# Patient Record
Sex: Female | Born: 1960
Health system: Southern US, Community
[De-identification: ages and names within clinical notes are randomized; demographics above are authoritative.]

## PROBLEM LIST (undated history)

## (undated) DIAGNOSIS — I10 Essential (primary) hypertension: Secondary | ICD-10-CM

## (undated) DIAGNOSIS — K219 Gastro-esophageal reflux disease without esophagitis: Secondary | ICD-10-CM

## (undated) HISTORY — DX: Gastro-esophageal reflux disease without esophagitis: K21.9

---

## 1991-11-10 HISTORY — PX: TUBAL LIGATION: SHX77

## 1999-10-16 ENCOUNTER — Other Ambulatory Visit: Admission: RE | Admit: 1999-10-16 | Discharge: 1999-10-16 | Payer: Self-pay | Admitting: Obstetrics and Gynecology

## 2000-12-01 ENCOUNTER — Other Ambulatory Visit: Admission: RE | Admit: 2000-12-01 | Discharge: 2000-12-01 | Payer: Self-pay | Admitting: Obstetrics and Gynecology

## 2005-06-01 ENCOUNTER — Encounter: Admission: RE | Admit: 2005-06-01 | Discharge: 2005-08-30 | Payer: Self-pay | Admitting: Internal Medicine

## 2006-06-25 ENCOUNTER — Ambulatory Visit (HOSPITAL_COMMUNITY): Admission: RE | Admit: 2006-06-25 | Discharge: 2006-06-25 | Payer: Self-pay | Admitting: Internal Medicine

## 2009-06-18 ENCOUNTER — Ambulatory Visit (HOSPITAL_COMMUNITY): Admission: RE | Admit: 2009-06-18 | Discharge: 2009-06-18 | Payer: Self-pay | Admitting: Internal Medicine

## 2009-07-26 ENCOUNTER — Ambulatory Visit (HOSPITAL_COMMUNITY): Admission: RE | Admit: 2009-07-26 | Discharge: 2009-07-26 | Payer: Self-pay | Admitting: Gastroenterology

## 2009-12-25 ENCOUNTER — Emergency Department (HOSPITAL_COMMUNITY): Admission: EM | Admit: 2009-12-25 | Discharge: 2009-12-25 | Payer: Self-pay | Admitting: Emergency Medicine

## 2010-12-01 ENCOUNTER — Encounter: Payer: Self-pay | Admitting: Internal Medicine

## 2012-08-01 ENCOUNTER — Other Ambulatory Visit (HOSPITAL_COMMUNITY): Payer: Self-pay | Admitting: Internal Medicine

## 2012-08-01 DIAGNOSIS — Z1231 Encounter for screening mammogram for malignant neoplasm of breast: Secondary | ICD-10-CM

## 2012-08-08 ENCOUNTER — Ambulatory Visit
Admission: RE | Admit: 2012-08-08 | Discharge: 2012-08-08 | Disposition: A | Discharge status: Home / Self Care | Payer: 59 | Source: Ambulatory Visit | Attending: Internal Medicine | Admitting: Internal Medicine

## 2012-08-08 DIAGNOSIS — Z1231 Encounter for screening mammogram for malignant neoplasm of breast: Secondary | ICD-10-CM

## 2012-09-07 ENCOUNTER — Emergency Department (HOSPITAL_COMMUNITY): Payer: 59

## 2012-09-07 ENCOUNTER — Emergency Department (HOSPITAL_COMMUNITY)
Admission: EM | Admit: 2012-09-07 | Discharge: 2012-09-07 | Disposition: A | Discharge status: Home / Self Care | Payer: 59 | Attending: Emergency Medicine | Admitting: Emergency Medicine

## 2012-09-07 ENCOUNTER — Encounter (HOSPITAL_COMMUNITY): Payer: Self-pay

## 2012-09-07 DIAGNOSIS — Z79899 Other long term (current) drug therapy: Secondary | ICD-10-CM | POA: Insufficient documentation

## 2012-09-07 DIAGNOSIS — I959 Hypotension, unspecified: Secondary | ICD-10-CM | POA: Insufficient documentation

## 2012-09-07 DIAGNOSIS — R109 Unspecified abdominal pain: Secondary | ICD-10-CM | POA: Insufficient documentation

## 2012-09-07 DIAGNOSIS — R42 Dizziness and giddiness: Secondary | ICD-10-CM | POA: Insufficient documentation

## 2012-09-07 DIAGNOSIS — I10 Essential (primary) hypertension: Secondary | ICD-10-CM | POA: Insufficient documentation

## 2012-09-07 DIAGNOSIS — D649 Anemia, unspecified: Secondary | ICD-10-CM | POA: Insufficient documentation

## 2012-09-07 HISTORY — DX: Essential (primary) hypertension: I10

## 2012-09-07 LAB — CBC WITH DIFFERENTIAL/PLATELET
Basophils Absolute: 0.1 10*3/uL (ref 0.0–0.1)
Basophils Relative: 1 % (ref 0–1)
HCT: 30.4 % — ABNORMAL LOW (ref 36.0–46.0)
Lymphocytes Relative: 35 % (ref 12–46)
MCH: 19.8 pg — ABNORMAL LOW (ref 26.0–34.0)
MCHC: 31.6 g/dL (ref 30.0–36.0)
MCV: 62.8 fL — ABNORMAL LOW (ref 78.0–100.0)
Monocytes Absolute: 0.7 10*3/uL (ref 0.1–1.0)
Platelets: 373 10*3/uL (ref 150–400)
RBC: 4.84 MIL/uL (ref 3.87–5.11)

## 2012-09-07 LAB — URINALYSIS, ROUTINE W REFLEX MICROSCOPIC
Glucose, UA: NEGATIVE mg/dL
Leukocytes, UA: NEGATIVE
Nitrite: NEGATIVE
Protein, ur: NEGATIVE mg/dL
Urobilinogen, UA: 1 mg/dL (ref 0.0–1.0)

## 2012-09-07 LAB — URINE MICROSCOPIC-ADD ON

## 2012-09-07 LAB — BASIC METABOLIC PANEL
BUN: 14 mg/dL (ref 6–23)
Calcium: 8.9 mg/dL (ref 8.4–10.5)
Chloride: 101 mEq/L (ref 96–112)
Creatinine, Ser: 0.96 mg/dL (ref 0.50–1.10)
Glucose, Bld: 110 mg/dL — ABNORMAL HIGH (ref 70–99)
Potassium: 3.2 mEq/L — ABNORMAL LOW (ref 3.5–5.1)
Sodium: 137 mEq/L (ref 135–145)

## 2012-09-07 MED ORDER — SODIUM CHLORIDE 0.9 % IV BOLUS (SEPSIS)
1000.0000 mL | Freq: Once | INTRAVENOUS | Status: AC
  Administered 2012-09-07: 1000 mL via INTRAVENOUS

## 2012-09-07 MED ORDER — FERROUS SULFATE 325 (65 FE) MG PO TABS: 325.0000 mg | ORAL_TABLET | Freq: Every day | ORAL | Status: DC

## 2012-09-07 MED ORDER — ONDANSETRON HCL 4 MG/2ML IJ SOLN
4.0000 mg | Freq: Once | INTRAMUSCULAR | Status: DC
  Filled 2012-09-07: qty 2

## 2012-09-07 NOTE — ED Notes (Signed)
Patient vomited.

## 2012-09-07 NOTE — ED Provider Notes (Signed)
History     CSN: [id number]  Arrival date & time 09/07/12  1333   First MD Initiated Contact with Patient 09/07/12 1335      Chief Complaint  Patient presents with  . Hypotension    (Consider location/radiation/quality/duration/timing/severity/associated sxs/prior treatment) The history is provided by the patient.   patient presents with a syncopal episode. She was at lunch today and became lightheaded and passed out. No chest pain. She states she does have some lower abdominal pain. No shortness of breath. She's had some nauseousness but not vomiting. She states that she recently had a period but has not had one in 5 months. Patient states she took a second dose of her Diovan accidentally last night. She normally takes it once a day in the morning.  Past Medical History  Diagnosis Date  . Hypertension     Past Surgical History  Procedure Date  . Tubal ligation 1993    No family history on file.  History  Substance Use Topics  . Smoking status: Never Smoker   . Smokeless tobacco: Not on file  . Alcohol Use: No    OB History    Grav Para Term Preterm Abortions TAB SAB Ect Mult Living                  Review of Systems  Constitutional: Negative for activity change and appetite change.  HENT: Negative for neck stiffness.   Eyes: Negative for pain.  Respiratory: Negative for chest tightness and shortness of breath.   Cardiovascular: Negative for chest pain and leg swelling.  Gastrointestinal: Negative for nausea, vomiting, abdominal pain and diarrhea.  Genitourinary: Negative for flank pain.  Musculoskeletal: Negative for back pain.  Skin: Negative for rash.  Neurological: Positive for syncope and light-headedness. Negative for weakness, numbness and headaches.  Psychiatric/Behavioral: Negative for behavioral problems.    Allergies  Codeine  Home Medications   Current Outpatient Rx  Name Route Sig Dispense Refill  . CHOLECALCIFEROL PO Oral Take 1 tablet by  mouth daily.    Marland Kitchen FISH OIL PO Oral Take 1 capsule by mouth daily.    Marland Kitchen OVER THE COUNTER MEDICATION Oral Take 1 packet by mouth every morning. OTC Soy Supplement.    Marland Kitchen VALSARTAN-HYDROCHLOROTHIAZIDE 160-25 MG PO TABS Oral Take 1 tablet by mouth daily.      BP 124/64  Pulse 75  Temp 97.4 F (36.3 C) (Oral)  Resp 10  SpO2 100%  LMP 09/07/2012  Physical Exam  Nursing note and vitals reviewed. Constitutional: She is oriented to person, place, and time. She appears well-developed and well-nourished.  HENT:  Head: Normocephalic and atraumatic.  Eyes: EOM are normal. Pupils are equal, round, and reactive to light.  Neck: Normal range of motion. Neck supple.  Cardiovascular: Normal rate, regular rhythm and normal heart sounds.   No murmur heard. Pulmonary/Chest: Effort normal and breath sounds normal. No respiratory distress. She has no wheezes. She has no rales.  Abdominal: Soft. Bowel sounds are normal. She exhibits no distension. There is tenderness. There is no rebound and no guarding.       Mild suprapubic tenderness without rebound or guarding.  Musculoskeletal: Normal range of motion.  Neurological: She is alert and oriented to person, place, and time. No cranial nerve deficit.  Skin: Skin is warm and dry.  Psychiatric: She has a normal mood and affect. Her speech is normal.    ED Course  Procedures (including critical care time)   Labs Reviewed  BASIC METABOLIC PANEL  CBC WITH DIFFERENTIAL  URINALYSIS, ROUTINE W REFLEX MICROSCOPIC  TROPONIN I   No results found.   No diagnosis found.   Date: 09/07/2012  Rate: 64  Rhythm: normal sinus rhythm  QRS Axis: normal  Intervals: normal  ST/T Wave abnormalities: nonspecific T wave changes  Conduction Disutrbances:none  Narrative Interpretation:   Old EKG Reviewed: none available    MDM  Patient with syncope and hypotension. Likely related to extra dose of her antihypertensive. Blood pressures improved here. She also  has an anemia. Patient has not noted black stools. She deferred a rectal exam at this time and will followup with her primary care Dr. Urinalysis is pending at this time, however patient likely be discharged        [facility] R. Rubin Payor, MD 09/07/12 [phone]

## 2012-09-07 NOTE — ED Notes (Signed)
Patient undressed and in a gown. Cardiac monitor, pulse oximetry, and blood pressure cuff on. 

## 2012-09-07 NOTE — ED Notes (Signed)
Patient felt weak and syncopal

## 2013-10-16 ENCOUNTER — Other Ambulatory Visit: Payer: Self-pay

## 2013-10-16 DIAGNOSIS — Z1231 Encounter for screening mammogram for malignant neoplasm of breast: Secondary | ICD-10-CM

## 2013-11-14 ENCOUNTER — Ambulatory Visit
Admission: RE | Admit: 2013-11-14 | Discharge: 2013-11-14 | Disposition: A | Discharge status: Home / Self Care | Payer: 59 | Source: Ambulatory Visit

## 2013-11-14 DIAGNOSIS — Z1231 Encounter for screening mammogram for malignant neoplasm of breast: Secondary | ICD-10-CM

## 2014-04-14 ENCOUNTER — Encounter: Payer: 59 | Attending: Internal Medicine | Admitting: *Deleted

## 2014-04-14 DIAGNOSIS — Z713 Dietary counseling and surveillance: Secondary | ICD-10-CM | POA: Insufficient documentation

## 2014-04-14 DIAGNOSIS — E669 Obesity, unspecified: Secondary | ICD-10-CM | POA: Insufficient documentation

## 2014-04-14 NOTE — Progress Notes (Signed)
Patient was seen on 04/14/2014 for the Weight Loss Class at the [facility]. The following learning objectives were met by the patient during this class:   Describe healthy choices in each food group  Describe portion size of foods  Use plate method for meal planning  Demonstrate how to read Nutrition Facts food label  Set realistic goals for weight loss, diet changes, and physical activity.   Goals:  1. Make healthy food choices in each food group.  2. Reduce portion size of foods.  3. Increase fruit and vegetable intake.  4. Use plate method for meal planning.  5. Increase physical activity.   Handouts given:  1. Weight loss tips 2. Meal plan/portion card 2. Plate method  2. Food label handout 

## 2015-12-12 MED FILL — QSYMIA 11.25 MG-69 MG CAP: 11.25-69 | 30 days supply | Qty: 30 | Fill #2

## 2015-12-13 MED FILL — VALSARTAN-HCTZ 160-25 MG TA: 160-25 | 60 days supply | Qty: 90 | Fill #0

## 2016-02-06 DIAGNOSIS — I10 Essential (primary) hypertension: Secondary | ICD-10-CM | POA: Diagnosis not present

## 2016-02-06 DIAGNOSIS — J309 Allergic rhinitis, unspecified: Secondary | ICD-10-CM | POA: Diagnosis not present

## 2016-02-06 DIAGNOSIS — R7309 Other abnormal glucose: Secondary | ICD-10-CM | POA: Diagnosis not present

## 2016-05-07 DIAGNOSIS — E784 Other hyperlipidemia: Secondary | ICD-10-CM | POA: Diagnosis not present

## 2016-05-07 DIAGNOSIS — R7303 Prediabetes: Secondary | ICD-10-CM | POA: Diagnosis not present

## 2016-05-07 DIAGNOSIS — I10 Essential (primary) hypertension: Secondary | ICD-10-CM | POA: Diagnosis not present

## 2017-02-08 DIAGNOSIS — E784 Other hyperlipidemia: Secondary | ICD-10-CM | POA: Diagnosis not present

## 2017-02-08 DIAGNOSIS — Z6837 Body mass index (BMI) 37.0-37.9, adult: Secondary | ICD-10-CM | POA: Diagnosis not present

## 2017-02-08 DIAGNOSIS — Z1231 Encounter for screening mammogram for malignant neoplasm of breast: Secondary | ICD-10-CM | POA: Diagnosis not present

## 2017-02-08 DIAGNOSIS — Z0000 Encounter for general adult medical examination without abnormal findings: Secondary | ICD-10-CM | POA: Diagnosis not present

## 2017-02-08 DIAGNOSIS — R7303 Prediabetes: Secondary | ICD-10-CM | POA: Diagnosis not present

## 2017-02-08 DIAGNOSIS — R7309 Other abnormal glucose: Secondary | ICD-10-CM | POA: Diagnosis not present

## 2017-02-08 DIAGNOSIS — E669 Obesity, unspecified: Secondary | ICD-10-CM | POA: Diagnosis not present

## 2017-02-08 DIAGNOSIS — I10 Essential (primary) hypertension: Secondary | ICD-10-CM | POA: Diagnosis not present

## 2017-08-09 DIAGNOSIS — E669 Obesity, unspecified: Secondary | ICD-10-CM | POA: Diagnosis not present

## 2017-08-09 DIAGNOSIS — Z1231 Encounter for screening mammogram for malignant neoplasm of breast: Secondary | ICD-10-CM | POA: Diagnosis not present

## 2017-08-09 DIAGNOSIS — R7303 Prediabetes: Secondary | ICD-10-CM | POA: Diagnosis not present

## 2017-08-09 DIAGNOSIS — E782 Mixed hyperlipidemia: Secondary | ICD-10-CM | POA: Diagnosis not present

## 2017-08-09 DIAGNOSIS — I10 Essential (primary) hypertension: Secondary | ICD-10-CM | POA: Diagnosis not present

## 2017-08-09 DIAGNOSIS — Z6837 Body mass index (BMI) 37.0-37.9, adult: Secondary | ICD-10-CM | POA: Diagnosis not present

## 2017-08-09 MED FILL — OLMESARTAN MEDOXOMIL 5 MG T: 5 | 90 days supply | Qty: 90 | Fill #0

## 2017-09-20 DIAGNOSIS — I10 Essential (primary) hypertension: Secondary | ICD-10-CM | POA: Diagnosis not present

## 2017-11-10 MED FILL — OLMESARTAN MEDOXOMIL 5 MG T: 5 | 90 days supply | Qty: 90 | Fill #1

## 2017-11-11 ENCOUNTER — Other Ambulatory Visit: Payer: Self-pay | Admitting: Nurse Practitioner

## 2017-11-11 DIAGNOSIS — Z1231 Encounter for screening mammogram for malignant neoplasm of breast: Secondary | ICD-10-CM

## 2017-12-01 ENCOUNTER — Encounter: Payer: Self-pay | Admitting: Radiology

## 2017-12-01 ENCOUNTER — Ambulatory Visit
Admission: RE | Admit: 2017-12-01 | Discharge: 2017-12-01 | Disposition: A | Discharge status: Home / Self Care | Payer: 59 | Source: Ambulatory Visit | Attending: Nurse Practitioner | Admitting: Nurse Practitioner

## 2017-12-01 DIAGNOSIS — Z1231 Encounter for screening mammogram for malignant neoplasm of breast: Secondary | ICD-10-CM | POA: Diagnosis not present

## 2018-02-14 DIAGNOSIS — Z01411 Encounter for gynecological examination (general) (routine) with abnormal findings: Secondary | ICD-10-CM | POA: Diagnosis not present

## 2018-02-14 DIAGNOSIS — Z01419 Encounter for gynecological examination (general) (routine) without abnormal findings: Secondary | ICD-10-CM | POA: Diagnosis not present

## 2018-02-14 DIAGNOSIS — R7303 Prediabetes: Secondary | ICD-10-CM | POA: Diagnosis not present

## 2018-02-14 DIAGNOSIS — Z1231 Encounter for screening mammogram for malignant neoplasm of breast: Secondary | ICD-10-CM | POA: Diagnosis not present

## 2018-02-14 DIAGNOSIS — Z0000 Encounter for general adult medical examination without abnormal findings: Secondary | ICD-10-CM | POA: Diagnosis not present

## 2018-02-14 DIAGNOSIS — I10 Essential (primary) hypertension: Secondary | ICD-10-CM | POA: Diagnosis not present

## 2018-02-14 DIAGNOSIS — E782 Mixed hyperlipidemia: Secondary | ICD-10-CM | POA: Diagnosis not present

## 2018-02-14 DIAGNOSIS — M79643 Pain in unspecified hand: Secondary | ICD-10-CM | POA: Diagnosis not present

## 2018-02-14 MED FILL — OLMESARTAN MEDOXOMIL 5 MG T: 5 | 90 days supply | Qty: 90 | Fill #0 | Status: TO

## 2018-05-13 MED FILL — OLMESARTAN MEDOXOMIL 5 MG T: 5 | 90 days supply | Qty: 90 | Fill #0

## 2018-08-06 ENCOUNTER — Encounter: Payer: Self-pay | Admitting: Nurse Practitioner

## 2018-08-06 DIAGNOSIS — I10 Essential (primary) hypertension: Secondary | ICD-10-CM

## 2018-08-06 DIAGNOSIS — M79643 Pain in unspecified hand: Secondary | ICD-10-CM | POA: Insufficient documentation

## 2018-08-06 DIAGNOSIS — Z1231 Encounter for screening mammogram for malignant neoplasm of breast: Secondary | ICD-10-CM | POA: Insufficient documentation

## 2018-08-15 ENCOUNTER — Ambulatory Visit: Payer: Self-pay | Admitting: Nurse Practitioner

## 2018-08-17 ENCOUNTER — Ambulatory Visit: Payer: Self-pay | Admitting: Nurse Practitioner

## 2018-08-19 ENCOUNTER — Encounter: Payer: Self-pay | Admitting: Nurse Practitioner

## 2018-08-19 ENCOUNTER — Ambulatory Visit (INDEPENDENT_AMBULATORY_CARE_PROVIDER_SITE_OTHER): Payer: 59 | Admitting: Nurse Practitioner

## 2018-08-19 VITALS — BP 132/100 | HR 66 | Temp 98.4°F | Ht 60.25 in | Wt 196.4 lb

## 2018-08-19 DIAGNOSIS — E782 Mixed hyperlipidemia: Secondary | ICD-10-CM | POA: Diagnosis not present

## 2018-08-19 DIAGNOSIS — I10 Essential (primary) hypertension: Secondary | ICD-10-CM | POA: Diagnosis not present

## 2018-08-19 DIAGNOSIS — R7303 Prediabetes: Secondary | ICD-10-CM | POA: Diagnosis not present

## 2018-08-19 MED ORDER — OLMESARTAN MEDOXOMIL 5 MG PO TABS: 5.0000 mg | ORAL_TABLET | Freq: Every day | ORAL | 1 refills | Status: DC

## 2018-08-19 MED FILL — OLMESARTAN MEDOXOMIL 5 MG T: 5 | 90 days supply | Qty: 90 | Fill #0

## 2018-08-19 NOTE — Patient Instructions (Addendum)
-   avoid eating soup and high salt foods.

## 2018-08-19 NOTE — Progress Notes (Addendum)
  Subjective:     Patient ID: Catherine Fritz , female    DOB: 09/01/1961 , 57 y.o.   MRN: [id number]   Drinks approximately 6-10 cups of water daily.  She had her flu shot on Monday.    Hypertension  This is a chronic problem. The current episode started more than 1 year ago. The problem has been gradually worsening since onset. The problem is uncontrolled. Associated symptoms include headaches. Pertinent negatives include no chest pain. (She had the flu shot - at [facility]) Risk factors for coronary artery disease include obesity. Past treatments include angiotensin blockers and lifestyle changes. There are no compliance problems (She has been eating soup all week.).      Past Medical History:  Diagnosis Date  . Hypertension       Current Outpatient Medications:  Marland Kitchen  MULTIPLE VITAMIN PO, Take 1 tablet by mouth daily., Disp: , Rfl:  .  olmesartan (BENICAR) 5 MG tablet, Take 5 mg by mouth daily., Disp: , Rfl:  .  Omega-3 Fatty Acids (FISH OIL PO), Take 1 capsule by mouth daily., Disp: , Rfl:    Allergies  Allergen Reactions  . Codeine Rash     Review of Systems  Constitutional: Negative for fatigue.  Respiratory: Negative.   Cardiovascular: Negative.  Negative for chest pain.  Musculoskeletal: Positive for arthralgias.       Sore right shoulder where she had her flu shot.  Neurological: Positive for headaches.     Today's Vitals   08/19/18 1552  BP: (!) 132/100  Pulse: 66  Temp: 98.4 F (36.9 C)  TempSrc: Oral  SpO2: 98%  Weight: 196 lb 6.4 oz (89.1 kg)  Height: 5' 0.25" (1.53 m)   Body mass index is 38.04 kg/m.   Objective:  Physical Exam  Constitutional: She is oriented to person, place, and time. She appears well-developed and well-nourished.  Eyes: Pupils are equal, round, and reactive to light.  Cardiovascular: Normal rate, regular rhythm, normal heart sounds and intact distal pulses.  Pulmonary/Chest: Effort normal and breath sounds normal.   Neurological: She is alert and oriented to person, place, and time.  Skin: Skin is warm and dry. Capillary refill takes less than 2 seconds.  Psychiatric: She has a normal mood and affect.        Assessment And Plan:   1. Essential hypertension Chronic, elevated today. She is advised to check her blood pressure at home and if remains elevated to take additional olmesartan '5mg'$  this evening, check over the weekend if still elevated take the extra dose and call on Monday with her readings.   Advised if she has increased headaches, blurred vision, nausea or vomiting to go to ER for evaluation - olmesartan (BENICAR) 5 MG tablet; Take 1 tablet (5 mg total) by mouth daily.  Dispense: 90 tablet; Refill: 1  2. Prediabetes  Chronic  Encouraged to increase physical activity and limit intake of sugary foods and drinks.  - BMP8+eGFR - Hemoglobin A1c - Lipid Profile  3. Mixed hyperlipidemia  Chronic,  No current medications  Will check lipid panel - BMP8+eGFR - Hemoglobin A1c - Lipid Profile     [name], FNP

## 2018-08-20 LAB — BMP8+EGFR
BUN/Creatinine Ratio: 11 (ref 9–23)
BUN: 10 mg/dL (ref 6–24)
CHLORIDE: 98 mmol/L (ref 96–106)
CO2: 23 mmol/L (ref 20–29)
Calcium: 10 mg/dL (ref 8.7–10.2)
Creatinine, Ser: 0.94 mg/dL (ref 0.57–1.00)
GFR calc non Af Amer: 67 mL/min/{1.73_m2} (ref >59)
GFR, EST AFRICAN AMERICAN: 77 mL/min/{1.73_m2} (ref >59)
Glucose: 81 mg/dL (ref 65–99)
POTASSIUM: 3.8 mmol/L (ref 3.5–5.2)
Sodium: 139 mmol/L (ref 134–144)

## 2018-08-20 LAB — HEMOGLOBIN A1C
Est. average glucose Bld gHb Est-mCnc: 123 mg/dL
Hgb A1c MFr Bld: 5.9 % — ABNORMAL HIGH (ref 4.8–5.6)

## 2018-08-20 LAB — LIPID PANEL
Chol/HDL Ratio: 3.4 ratio (ref 0.0–4.4)
Cholesterol, Total: 235 mg/dL — ABNORMAL HIGH (ref 100–199)
HDL: 69 mg/dL (ref >39)
LDL Calculated: 149 mg/dL — ABNORMAL HIGH (ref 0–99)
Triglycerides: 84 mg/dL (ref 0–149)
VLDL Cholesterol Cal: 17 mg/dL (ref 5–40)

## 2018-09-27 ENCOUNTER — Other Ambulatory Visit: Payer: Self-pay

## 2018-09-27 ENCOUNTER — Telehealth: Payer: Self-pay

## 2018-09-27 DIAGNOSIS — I10 Essential (primary) hypertension: Secondary | ICD-10-CM

## 2018-09-27 MED ORDER — OLMESARTAN MEDOXOMIL 5 MG PO TABS: 5.0000 mg | ORAL_TABLET | Freq: Every day | ORAL | 0 refills | Status: DC

## 2018-09-27 NOTE — Telephone Encounter (Signed)
Patient called stating her blood pressure has been elevated at 167/88 has been taking benicar 2 tabs a day in the morning and sometimes her bp flucutiates down to 120. Patient wants to know if you want to refill her med or switch her to something different.  Returned pt call after speaking with Arnette FeltsJanece Moore FNP-BC and I advised her to continue to take Benicar BID and check her bp an hour after taking the medication and call on Thursday if her blood pressure is still high. Refill sent to the pharmacy pt stated she did not have any more medicine. YRL,RMA

## 2018-09-28 ENCOUNTER — Other Ambulatory Visit: Payer: Self-pay

## 2018-09-28 DIAGNOSIS — I10 Essential (primary) hypertension: Secondary | ICD-10-CM

## 2018-09-28 MED ORDER — OLMESARTAN MEDOXOMIL 5 MG PO TABS: 5.0000 mg | ORAL_TABLET | Freq: Two times a day (BID) | ORAL | 1 refills | Status: DC

## 2018-09-29 MED FILL — OLMESARTAN MEDOXOMIL 5 MG T: 5 | 30 days supply | Qty: 60 | Fill #0

## 2018-10-27 MED FILL — OLMESARTAN MEDOXOMIL 5 MG T: 5 | 60 days supply | Qty: 120 | Fill #1

## 2018-12-08 ENCOUNTER — Encounter: Payer: Self-pay | Admitting: Nurse Practitioner

## 2018-12-08 ENCOUNTER — Ambulatory Visit: Payer: 59 | Admitting: Nurse Practitioner

## 2018-12-08 VITALS — BP 142/100 | HR 72 | Temp 98.2°F

## 2018-12-08 DIAGNOSIS — R0683 Snoring: Secondary | ICD-10-CM

## 2018-12-08 DIAGNOSIS — Z8349 Family history of other endocrine, nutritional and metabolic diseases: Secondary | ICD-10-CM

## 2018-12-08 DIAGNOSIS — E782 Mixed hyperlipidemia: Secondary | ICD-10-CM

## 2018-12-08 DIAGNOSIS — R7303 Prediabetes: Secondary | ICD-10-CM | POA: Insufficient documentation

## 2018-12-08 DIAGNOSIS — I10 Essential (primary) hypertension: Secondary | ICD-10-CM | POA: Diagnosis not present

## 2018-12-08 MED ORDER — HYDROCHLOROTHIAZIDE 12.5 MG PO TABS: 12.5000 mg | ORAL_TABLET | Freq: Every day | ORAL | 2 refills | Status: DC

## 2018-12-08 MED FILL — HYDROCHLOROTHIAZIDE 12.5 MG: 12.5 | 30 days supply | Qty: 30 | Fill #0

## 2018-12-08 NOTE — Progress Notes (Signed)
Subjective:     Patient ID: Noberto Retort , female    DOB: [birth year] , 58 y.o.   MRN: [id number]   Chief Complaint  Patient presents with  . Hypertension    HPI  Hypertension  This is a chronic problem. The current episode started more than 1 year ago. The problem has been gradually worsening since onset. The problem is uncontrolled. Pertinent negatives include no anxiety, blurred vision, chest pain, malaise/fatigue or palpitations. There are no associated agents to hypertension. There are no known risk factors for coronary artery disease. Past treatments include angiotensin blockers. The current treatment provides moderate improvement. There are no compliance problems.  There is no history of angina. There is no history of chronic renal disease.     Past Medical History:  Diagnosis Date  . Hypertension      Family History  Problem Relation Age of Onset  . Breast cancer Paternal Aunt        in 78's     Current Outpatient Medications:  Marland Kitchen  MULTIPLE VITAMIN PO, Take 1 tablet by mouth daily., Disp: , Rfl:  .  olmesartan (BENICAR) 5 MG tablet, Take 1 tablet (5 mg total) by mouth 2 (two) times daily., Disp: 180 tablet, Rfl: 1   Allergies  Allergen Reactions  . Codeine Rash     Review of Systems  Constitutional: Negative for malaise/fatigue.  Eyes: Negative for blurred vision.  Respiratory: Negative for cough.   Cardiovascular: Negative for chest pain, palpitations and leg swelling.     Today's Vitals   12/08/18 1049  BP: (!) 142/100  Pulse: 72  Temp: 98.2 F (36.8 C)  TempSrc: Oral  PainSc: 0-No pain   There is no height or weight on file to calculate BMI.   Objective:  Physical Exam Vitals signs reviewed.  Constitutional:      General: She is not in acute distress.    Appearance: Normal appearance. She is well-developed. She is obese.  HENT:     Head: Normocephalic and atraumatic.  Eyes:     Pupils: Pupils are equal, round, and reactive to light.   Cardiovascular:     Rate and Rhythm: Normal rate and regular rhythm.     Pulses: Normal pulses.     Heart sounds: Normal heart sounds. No murmur.  Pulmonary:     Effort: Pulmonary effort is normal.     Breath sounds: Normal breath sounds.  Musculoskeletal: Normal range of motion.  Skin:    General: Skin is warm and dry.     Capillary Refill: Capillary refill takes less than 2 seconds.  Neurological:     General: No focal deficit present.     Mental Status: She is alert and oriented to person, place, and time.     Cranial Nerves: No cranial nerve deficit.  Psychiatric:        Mood and Affect: Mood normal.         Assessment And Plan:     1. Essential hypertension  She has cut back to 3 eight hour shifts per week.    Chronic, remains elevated will add 12.5 mg HCTZ and have her to follow up in 4-6 weeks.   - hydrochlorothiazide (HYDRODIURIL) 12.5 MG tablet; Take 1 tablet (12.5 mg total) by mouth daily.  Dispense: 30 tablet; Refill: 2  2. Prediabetes  Chronic, controlled  Continue with current medications  Encouraged to limit intake of sugary foods and drinks  Encouraged to increase physical activity to 150 minutes  per week  3. Mixed hyperlipidemia  Chronic, controlled  No current medications  4. Snoring  Reports snoring  Denies any fatigue or daytime sleepiness  Her blood pressure remains elevated in spite of dual therapy, may be from poor sleep  Will send for sleep study  5. Family history thyroid disease in brother  Sibling had a large goiter removed. - TSH    Arnette FeltsJanece Marra Fraga, FNP

## 2019-01-03 ENCOUNTER — Other Ambulatory Visit: Payer: Self-pay | Admitting: Nurse Practitioner

## 2019-01-05 MED FILL — HYDROCHLOROTHIAZIDE 12.5 MG: 12.5 | 30 days supply | Qty: 30 | Fill #1 | Status: TO

## 2019-01-05 MED FILL — OLMESARTAN MEDOXOMIL 5 MG T: 5 | 60 days supply | Qty: 120 | Fill #2

## 2019-01-18 ENCOUNTER — Encounter: Payer: Self-pay | Admitting: Nurse Practitioner

## 2019-01-18 ENCOUNTER — Other Ambulatory Visit: Payer: Self-pay

## 2019-01-18 ENCOUNTER — Ambulatory Visit: Payer: 59 | Admitting: Nurse Practitioner

## 2019-01-18 VITALS — BP 142/98 | HR 78 | Ht 60.0 in | Wt 197.0 lb

## 2019-01-18 DIAGNOSIS — I10 Essential (primary) hypertension: Secondary | ICD-10-CM | POA: Diagnosis not present

## 2019-01-18 DIAGNOSIS — R2231 Localized swelling, mass and lump, right upper limb: Secondary | ICD-10-CM | POA: Diagnosis not present

## 2019-01-18 MED ORDER — AMLODIPINE BESYLATE 5 MG PO TABS: 5.0000 mg | ORAL_TABLET | Freq: Every day | ORAL | 11 refills | Status: DC

## 2019-01-18 MED FILL — AMLODIPINE BESYLATE 5 MG TA: 5 | 30 days supply | Qty: 30 | Fill #0 | Status: TO

## 2019-01-18 NOTE — Progress Notes (Signed)
  Subjective:     Patient ID: Catherine Fritz , female    DOB: December 16, 1960 , 58 y.o.   MRN: [id number]   Chief Complaint  Patient presents with  . Hypertension    htn , dicuss have order for mammogram     HPI  Hypertension  This is a chronic problem. The current episode started more than 1 year ago. Pertinent negatives include no chest pain, headaches or palpitations.     Past Medical History:  Diagnosis Date  . Hypertension      Family History  Problem Relation Age of Onset  . Breast cancer Paternal Aunt        in 14's     Current Outpatient Medications:  .  hydrochlorothiazide (HYDRODIURIL) 12.5 MG tablet, Take 1 tablet (12.5 mg total) by mouth daily., Disp: 30 tablet, Rfl: 2 .  MULTIPLE VITAMIN PO, Take 1 tablet by mouth daily., Disp: , Rfl:  .  olmesartan (BENICAR) 5 MG tablet, Take 1 tablet (5 mg total) by mouth 2 (two) times daily., Disp: 180 tablet, Rfl: 1   Allergies  Allergen Reactions  . Codeine Rash     Review of Systems  Respiratory: Negative for cough and wheezing.   Cardiovascular: Negative.  Negative for chest pain, palpitations and leg swelling.  Endocrine: Negative for polydipsia, polyphagia and polyuria.  Musculoskeletal: Negative for arthralgias.  Neurological: Negative for dizziness and headaches.     Today's Vitals   01/18/19 1109  BP: (!) 142/98  Pulse: 78  SpO2: 97%  Weight: 197 lb (89.4 kg)  Height: 5' (1.524 m)   Body mass index is 38.47 kg/m.   Objective:  Physical Exam Vitals signs reviewed.  Constitutional:      Appearance: Normal appearance.  Cardiovascular:     Rate and Rhythm: Normal rate and regular rhythm.     Pulses: Normal pulses.     Heart sounds: Normal heart sounds. No murmur.  Pulmonary:     Effort: Pulmonary effort is normal.     Breath sounds: Normal breath sounds.  Skin:    General: Skin is warm and dry.     Capillary Refill: Capillary refill takes less than 2 seconds.  Neurological:     General: No focal  deficit present.     Mental Status: She is alert.  Psychiatric:        Mood and Affect: Mood normal.        Behavior: Behavior normal.        Thought Content: Thought content normal.        Judgment: Judgment normal.         Assessment And Plan:     1. Essential hypertension . B/P is poorly controlled.  . I have advised to limit her intake of . CMP ordered to check renal function.  . The importance of regular exercise and dietary modification was stressed to the patient.  . Stressed importance of losing ten percent of her body weight to help with B/P control.  . The weight loss would help with decreasing cardiac and cancer risk as well.  - amLODipine (NORVASC) 5 MG tablet; Take 1 tablet (5 mg total) by mouth daily.  Dispense: 30 tablet; Refill: 11  2. Mass of right axilla  Unable to feel mass however patient had felt mass  Will send for diagnostic mammogram   Arnette Felts, FNP

## 2019-01-24 ENCOUNTER — Other Ambulatory Visit: Payer: Self-pay

## 2019-01-24 ENCOUNTER — Ambulatory Visit (INDEPENDENT_AMBULATORY_CARE_PROVIDER_SITE_OTHER): Payer: 59 | Admitting: Neurology

## 2019-01-24 ENCOUNTER — Encounter: Payer: Self-pay | Admitting: Neurology

## 2019-01-24 VITALS — BP 144/101 | HR 78 | Ht 61.0 in | Wt 191.0 lb

## 2019-01-24 DIAGNOSIS — R030 Elevated blood-pressure reading, without diagnosis of hypertension: Secondary | ICD-10-CM | POA: Diagnosis not present

## 2019-01-24 DIAGNOSIS — R351 Nocturia: Secondary | ICD-10-CM | POA: Diagnosis not present

## 2019-01-24 DIAGNOSIS — G2581 Restless legs syndrome: Secondary | ICD-10-CM | POA: Diagnosis not present

## 2019-01-24 DIAGNOSIS — E669 Obesity, unspecified: Secondary | ICD-10-CM | POA: Diagnosis not present

## 2019-01-24 DIAGNOSIS — R0683 Snoring: Secondary | ICD-10-CM | POA: Diagnosis not present

## 2019-01-24 NOTE — Progress Notes (Signed)
Subjective:    Patient ID: Catherine Fritz is a 58 y.o. female.  HPI     Catherine Foley, MD, PhD [facility] [address], [address] [address] [place], [place] [address]  Dear Catherine Fritz,   I saw your patient, Catherine Fritz, upon your kind request in my sleep clinic today for initial consultation of her sleep disorder, in particular, concern for underlying obstructive sleep apnea. The patient is unaccompanied today. As you know, Catherine Fritz is a 58 year old right-handed woman with an underlying medical history of hypertension, prediabetes, Hx of hyperlipidemia and obesity, who reports snoring and elevated blood pressure values despite being on medication, concern for OSA is a contributor. I reviewed your office note from 12/08/2018. Her Epworth sleepiness score is 9 out of 24 today, fatigue score is 17 out of 63. She works for [employer], lives with her husband, they have a total of 3 children. She is a nonsmoker and does not utilize alcohol and drinks caffeine in the form of tea  typically, about 2 cups per day on average. For blood pressure, she was on Diovan first, then Benicar, currently on amlodipine. She is also on hydrochlorothiazide. When she was able to lose weight in the realm of 30 pounds in the past, her blood pressure was still elevated. Snoring can be loud and noticeable by her husband, she has woken herself up with the occasional wheezing, she has no history of asthma. 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 She used to work for years from 7 AM to 7 PM. She would have difficulty winding down at night, for the past 3 months she has been able to work from 7 AM to 3 PM. Her bedtime is currently between 9:30 and 11:30. One of her daughter lives with them, she is working on her doctorate in physical therapy. Patient's rise time is generally around 5:45 or 6 AM. She has significant nocturia, up to 4 times a night, averaging about 2-3 times per night. She has had  some intermittent but mostly rare restless leg symptoms. She suspects that her dad had sleep apnea, he was not formally diagnosed, died at 45 from heart disease. She tries to exercise regularly, about 3-4 times per week on average. Weight has been +/- stable.  Her Past Medical History Is Significant For: Past Medical History:  Diagnosis Date  . Hypertension     Her Past Surgical History Is Significant For: Past Surgical History:  Procedure Laterality Date  . TUBAL LIGATION  1993    Her Family History Is Significant For: Family History  Problem Relation Age of Onset  . Breast cancer Paternal Aunt        in 65's    Her Social History Is Significant For: Social History   Socioeconomic History  . Marital status: Married    Spouse name: Not on file  . Number of children: Not on file  . Years of education: Not on file  . Highest education level: Not on file  Occupational History  . Not on file  Social Needs  . Financial resource strain: Not on file  . Food insecurity:    Worry: Not on file    Inability: Not on file  . Transportation needs:    Medical: Not on file    Non-medical: Not on file  Tobacco Use  . Smoking status: Never Smoker  . Smokeless tobacco: Never Used  Substance and Sexual Activity  . Alcohol use:  No  . Drug use: Never  . Sexual activity: Not on file  Lifestyle  . Physical activity:    Days per week: Not on file    Minutes per session: Not on file  . Stress: Not on file  Relationships  . Social connections:    Talks on phone: Not on file    Gets together: Not on file    Attends religious service: Not on file    Active member of club or organization: Not on file    Attends meetings of clubs or organizations: Not on file    Relationship status: Not on file  Other Topics Concern  . Not on file  Social History Narrative  . Not on file    Her Allergies Are:  Allergies  Allergen Reactions  . Codeine Rash  :   Her Current Medications Are:   Outpatient Encounter Medications as of 01/24/2019  Medication Sig  . amLODipine (NORVASC) 5 MG tablet Take 1 tablet (5 mg total) by mouth daily.  . hydrochlorothiazide (HYDRODIURIL) 12.5 MG tablet Take 1 tablet (12.5 mg total) by mouth daily.  . MULTIPLE VITAMIN PO Take 1 tablet by mouth daily.  . [DISCONTINUED] olmesartan (BENICAR) 5 MG tablet Take 1 tablet (5 mg total) by mouth 2 (two) times daily.   No facility-administered encounter medications on file as of 01/24/2019.   :  Review of Systems:  Out of a complete 14 point review of systems, all are reviewed and negative with the exception of these symptoms as listed below: Review of Systems  Neurological:       Pt presents today to discuss her sleep. Pt has never had a sleep study but does endorse snoring.  Epworth Sleepiness Scale 0= would never doze 1= slight chance of dozing 2= moderate chance of dozing 3= high chance of dozing  Sitting and reading: 2 Watching TV: 3 Sitting inactive in a public place (ex. Theater or meeting): 0 As a passenger in a car for an hour without a break: 0 Lying down to rest in the afternoon: 1 Sitting and talking to someone: 0 Sitting quietly after lunch (no alcohol): 2 In a car, while stopped in traffic: 1 Total: 9     Objective:  Neurological Exam  Physical Exam Physical Examination:   Vitals:   01/24/19 0845  BP: (!) 144/101  Pulse: 78    General Examination: The patient is a very pleasant 58 y.o. female in no acute distress. She appears well-developed and well-nourished and well groomed.   HEENT: Normocephalic, atraumatic, pupils are equal, round and reactive to light and accommodation. Corrective eyeglasses in place. Extraocular tracking is good without limitation to gaze excursion or nystagmus noted. Normal smooth pursuit is noted. Hearing is grossly intact. Face is symmetric with normal facial animation and normal facial sensation. Speech is clear with no dysarthria noted. There  is no hypophonia. There is no lip, neck/head, jaw or voice tremor. Neck is supple with full range of passive and active motion. Oropharynx exam reveals: mild mouth dryness, adequate dental hygiene and moderate airway crowding, due to smaller airway entry, redundant soft palate, Mallampati class III, tonsils of 1-2+ bilaterally. Neck circumference is 15-3/8 inches. She has no obvious overbite, perhaps a slight underbite even.  Chest, Heart, Abdomen: benign.   Skin: Warm and dry without trophic changes noted.  Musculoskeletal: exam reveals no obvious joint deformities, tenderness or joint swelling or erythema.   Neurologically:  Mental status: The patient is awake, alert and oriented  in all 4 spheres. Her immediate and remote memory, attention, language skills and fund of knowledge are appropriate. There is no evidence of aphasia, agnosia, apraxia or anomia. Speech is clear with normal prosody and enunciation. Thought process is linear. Mood is normal and affect is normal.  Cranial nerves II - XII are as described above under HEENT exam. In addition: shoulder shrug is normal with equal shoulder height noted. Motor exam: Normal bulk, strength and tone is noted. There is no drift, tremor or rebound. Romberg is negative. Reflexes are 2+ throughout. Fine motor skills and coordination: intact with normal finger taps, normal hand movements, normal rapid alternating patting, normal foot taps and normal foot agility.  Cerebellar testing: No dysmetria or intention tremor on finger to nose testing. There is no truncal or gait ataxia.  Sensory exam: intact to light touch in the upper and lower extremities.  Gait, station and balance: She stands easily. No veering to one side is noted. No leaning to one side is noted. Posture is age-appropriate and stance is narrow based. Gait shows normal stride length and normal pace. No problems turning are noted. Tandem walk is unremarkable.   Assessment and Plan:  In  summary, Catherine Fritz is a very pleasant 58 y.o.-year old female ith an underlying medical history of hypertension, prediabetes, Hx of hyperlipidemia and obesity, whose history and physical exam are concerning for obstructive sleep apnea (OSA). I had a long chat with the patient about my findings and the diagnosis of OSA, its prognosis and treatment options. We talked about medical treatments, surgical interventions and non-pharmacological approaches. I explained in particular the risks and ramifications of untreated moderate to severe OSA, especially with respect to developing cardiovascular disease down the Road, including congestive heart failure, difficult to treat hypertension, cardiac arrhythmias, or stroke. Even type 2 diabetes has, in part, been linked to untreated OSA. Symptoms of untreated OSA include daytime sleepiness, memory problems, mood irritability and mood disorder such as depression and anxiety, lack of energy, as well as recurrent headaches, especially morning headaches. We talked about trying to maintain a healthy lifestyle in general, as well as the importance of weight control. I encouraged the patient to eat healthy, exercise daily and keep well hydrated, to keep a scheduled bedtime and wake time routine, to not skip any meals and eat healthy snacks in between meals. I advised the patient not to drive when feeling sleepy. I recommended the following at this time: sleep study with potential positive airway pressure titration. (We will score hypopneas at 3%).   I explained the sleep test procedure to the patient and also outlined possible surgical and non-surgical treatment options of OSA, including the use of a custom-made dental device (which would require a referral to a specialist dentist), I also explained the CPAP treatment option to the patient, who indicated that she would be willing to try CPAP if the need arises. I explained the importance of being compliant with PAP treatment,  not only for insurance purposes but primarily to improve Her symptoms, and for the patient's long term health benefit, including to reduce Her cardiovascular risks. I answered all her questions today and the patient was in agreement. I plan to see her back after the sleep study is completed and encouraged her to call with any interim questions, concerns, problems or updates.   Thank you very much for allowing me to participate in the care of this nice patient. If I can be of any further assistance to you  please do not hesitate to call me at [phone].  Sincerely,   Star Age, MD, PhD

## 2019-01-24 NOTE — Patient Instructions (Signed)

## 2019-01-27 DIAGNOSIS — H5203 Hypermetropia, bilateral: Secondary | ICD-10-CM | POA: Diagnosis not present

## 2019-02-04 MED FILL — AMLODIPINE BESYLATE 5 MG TA: 5 | 30 days supply | Qty: 30 | Fill #0

## 2019-02-04 MED FILL — HYDROCHLOROTHIAZIDE 12.5 MG: 12.5 | 30 days supply | Qty: 30 | Fill #0

## 2019-02-05 ENCOUNTER — Encounter: Payer: Self-pay | Admitting: Nurse Practitioner

## 2019-02-07 ENCOUNTER — Other Ambulatory Visit: Payer: Self-pay | Admitting: Nurse Practitioner

## 2019-02-07 DIAGNOSIS — R2231 Localized swelling, mass and lump, right upper limb: Secondary | ICD-10-CM

## 2019-02-17 ENCOUNTER — Ambulatory Visit
Admission: RE | Admit: 2019-02-17 | Discharge: 2019-02-17 | Disposition: A | Discharge status: Home / Self Care | Payer: 59 | Source: Ambulatory Visit | Attending: Nurse Practitioner | Admitting: Nurse Practitioner

## 2019-02-17 ENCOUNTER — Other Ambulatory Visit: Payer: Self-pay

## 2019-02-17 DIAGNOSIS — R2231 Localized swelling, mass and lump, right upper limb: Secondary | ICD-10-CM | POA: Diagnosis not present

## 2019-02-17 DIAGNOSIS — N644 Mastodynia: Secondary | ICD-10-CM | POA: Diagnosis not present

## 2019-02-20 ENCOUNTER — Ambulatory Visit: Payer: 59 | Admitting: Nurse Practitioner

## 2019-03-13 ENCOUNTER — Other Ambulatory Visit: Payer: Self-pay

## 2019-03-13 ENCOUNTER — Ambulatory Visit: Payer: 59 | Admitting: Nurse Practitioner

## 2019-03-13 ENCOUNTER — Encounter: Payer: Self-pay | Admitting: Nurse Practitioner

## 2019-03-13 ENCOUNTER — Other Ambulatory Visit: Payer: Self-pay | Admitting: Nurse Practitioner

## 2019-03-13 VITALS — BP 150/92 | HR 69 | Temp 98.4°F | Ht 60.8 in | Wt 191.0 lb

## 2019-03-13 DIAGNOSIS — Z1159 Encounter for screening for other viral diseases: Secondary | ICD-10-CM | POA: Diagnosis not present

## 2019-03-13 DIAGNOSIS — I10 Essential (primary) hypertension: Secondary | ICD-10-CM

## 2019-03-13 DIAGNOSIS — R7303 Prediabetes: Secondary | ICD-10-CM | POA: Diagnosis not present

## 2019-03-13 DIAGNOSIS — Z0000 Encounter for general adult medical examination without abnormal findings: Secondary | ICD-10-CM

## 2019-03-13 DIAGNOSIS — E782 Mixed hyperlipidemia: Secondary | ICD-10-CM

## 2019-03-13 LAB — POCT URINALYSIS DIPSTICK
Bilirubin, UA: NEGATIVE
Blood, UA: NEGATIVE
Glucose, UA: NEGATIVE
Ketones, UA: NEGATIVE
Leukocytes, UA: NEGATIVE
Nitrite, UA: NEGATIVE
Protein, UA: NEGATIVE
Spec Grav, UA: 1.02 (ref 1.010–1.025)
Urobilinogen, UA: 0.2 E.U./dL
pH, UA: 6.5 (ref 5.0–8.0)

## 2019-03-13 LAB — POCT UA - MICROALBUMIN
Albumin/Creatinine Ratio, Urine, POC: <30
Creatinine, POC: 100 mg/dL
Microalbumin Ur, POC: 30 mg/L

## 2019-03-13 MED ORDER — AMLODIPINE BESYLATE 5 MG PO TABS: 5.0000 mg | ORAL_TABLET | Freq: Every day | ORAL | 11 refills | Status: DC

## 2019-03-13 MED ORDER — HYDROCHLOROTHIAZIDE 12.5 MG PO TABS: 12.5000 mg | ORAL_TABLET | Freq: Every day | ORAL | 2 refills | Status: DC

## 2019-03-13 MED FILL — HYDROCHLOROTHIAZIDE 12.5 MG: 12.5 | 30 days supply | Qty: 30 | Fill #0 | Status: TO

## 2019-03-13 MED FILL — AMLODIPINE BESYLATE 5 MG TA: 5 | 30 days supply | Qty: 30 | Fill #0 | Status: TO

## 2019-03-13 NOTE — Progress Notes (Addendum)
Subjective:     Patient ID: Catherine Fritz , female    DOB: 1961-04-13 , 58 y.o.   MRN: [id number]   Chief Complaint  Patient presents with  . Annual Exam   The patient states she uses post menopausal status for birth control. Last LMP was Patient's last menstrual period was 09/07/2012.. Negative for Dysmenorrhea and Negative for Menorrhagia Mammogram last done 02/17/2019.  Negative for: breast discharge, breast lump(s), breast pain and breast self exam.  Pertinent negatives include abnormal bleeding (hematology), anxiety, decreased libido, depression, difficulty falling sleep, dyspareunia, history of infertility, nocturia, sexual dysfunction, sleep disturbances, urinary incontinence, urinary urgency, vaginal discharge and vaginal itching. Diet regular.The patient states her exercise level is  walking 30 minutes to 1 hours opposite days kaleistetics and yoga.     The patient's tobacco use is:  Social History   Tobacco Use  Smoking Status Never Smoker  Smokeless Tobacco Never Used   She has been exposed to passive smoke. The patient's alcohol use is:  Social History   Substance and Sexual Activity  Alcohol Use No   Additional information: Last pap 2018, next one scheduled for 2021   HPI  HPI   Past Medical History:  Diagnosis Date  . Hypertension      Family History  Problem Relation Age of Onset  . Breast cancer Paternal Aunt        in 19's  . Hypertension Mother   . Heart disease Father   . Cancer Other   . Diabetes Sister   . Diabetes Brother   . Kidney disease Brother      Current Outpatient Medications:  Marland Kitchen  MULTIPLE VITAMIN PO, Take 1 tablet by mouth daily., Disp: , Rfl:  .  amLODipine (NORVASC) 5 MG tablet, Take 1 tablet (5 mg total) by mouth daily., Disp: 30 tablet, Rfl: 11 .  hydrochlorothiazide (HYDRODIURIL) 12.5 MG tablet, Take 1 tablet (12.5 mg total) by mouth daily., Disp: 30 tablet, Rfl: 2   Allergies  Allergen Reactions  . Codeine Rash     Review  of Systems  Constitutional: Negative.  Negative for fatigue.  HENT: Negative.   Eyes: Negative.   Respiratory: Negative.   Cardiovascular: Negative.   Gastrointestinal: Negative.   Endocrine: Negative.  Negative for polydipsia, polyphagia and polyuria.  Genitourinary: Negative.   Musculoskeletal: Negative.   Skin: Negative.   Allergic/Immunologic: Negative.   Neurological: Negative.   Hematological: Negative.   Psychiatric/Behavioral: Negative.      Today's Vitals   03/13/19 0930  BP: (!) 150/92  Pulse: 69  Temp: 98.4 F (36.9 C)  TempSrc: Oral  SpO2: 98%  Weight: 191 lb (86.6 kg)  Height: 5' 0.8" (1.544 m)   Body mass index is 36.33 kg/m.   Objective:  Physical Exam Constitutional:      Appearance: Normal appearance. She is well-developed.  HENT:     Head: Normocephalic and atraumatic.     Right Ear: Hearing, tympanic membrane, ear canal and external ear normal.     Left Ear: Hearing, tympanic membrane, ear canal and external ear normal.     Nose: Nose normal.     Mouth/Throat:     Mouth: Mucous membranes are moist.  Eyes:     General: Lids are normal.     Conjunctiva/sclera: Conjunctivae normal.     Pupils: Pupils are equal, round, and reactive to light.     Funduscopic exam:    Right eye: No papilledema.  Left eye: No papilledema.  Neck:     Musculoskeletal: Full passive range of motion without pain, normal range of motion and neck supple.     Thyroid: No thyroid mass.     Vascular: No carotid bruit.  Cardiovascular:     Rate and Rhythm: Normal rate and regular rhythm.     Pulses: Normal pulses.     Heart sounds: Normal heart sounds. No murmur.  Pulmonary:     Effort: Pulmonary effort is normal.     Breath sounds: Normal breath sounds.  Abdominal:     General: Abdomen is flat. Bowel sounds are normal.     Palpations: Abdomen is soft.  Musculoskeletal: Normal range of motion.        General: No swelling.     Right lower leg: No edema.     Left  lower leg: No edema.  Skin:    General: Skin is warm and dry.     Capillary Refill: Capillary refill takes less than 2 seconds.  Neurological:     General: No focal deficit present.     Mental Status: She is alert and oriented to person, place, and time.     Cranial Nerves: No cranial nerve deficit.     Sensory: No sensory deficit.  Psychiatric:        Mood and Affect: Mood normal.        Behavior: Behavior normal.        Thought Content: Thought content normal.        Judgment: Judgment normal.         Assessment And Plan:     1. Essential hypertension . B/P is controlled.  . CMP ordered to check renal function.  . The importance of regular exercise and dietary modification was stressed to the patient.  . Stressed importance of losing ten percent of her body weight to help with B/P control.  - EKG 12-Lead - CMP14 + Anion Gap - CBC no Diff - POCT UA - Microalbumin - POCT Urinalysis Dipstick (81002)  2. Prediabetes  Chronic, stable  No current medications  Encouraged to limit intake of sugary foods and drinks  Encouraged to increase physical activity to 150 minutes per week as tolerated - Hemoglobin A1c - CMP14 + Anion Gap - CBC no Diff - POCT UA - Microalbumin - POCT Urinalysis Dipstick (81002)  3. Mixed hyperlipidemia  Chronic, controlled  Continue with current medications - Lipid Profile  4. Encounter for hepatitis C screening test for low risk patient  Will check for Hepatitis C screening due to being born between the years [birth year] - Hepatitis C antibody  5. Health maintenance examination . Behavior modifications discussed and diet history reviewed.   . Pt will continue to exercise regularly and modify diet with low GI, plant based foods and decrease intake of processed foods.  . Recommend intake of daily multivitamin, Vitamin D, and calcium.  . Recommend mammogram  for preventive screenings, as well as recommend immunizations that include influenza,  TDAP   - Hepatitis C antibody  Arnette FeltsJanece Aleya Durnell, FNP    THE PATIENT IS ENCOURAGED TO PRACTICE SOCIAL DISTANCING DUE TO THE COVID-19 PANDEMIC.

## 2019-03-14 LAB — CMP14 + ANION GAP
ALT: 23 IU/L (ref 0–32)
AST: 23 IU/L (ref 0–40)
Albumin/Globulin Ratio: 1.7 (ref 1.2–2.2)
Albumin: 4.8 g/dL (ref 3.8–4.9)
Alkaline Phosphatase: 93 IU/L (ref 39–117)
Anion Gap: 18 mmol/L (ref 10.0–18.0)
BUN/Creatinine Ratio: 16 (ref 9–23)
BUN: 16 mg/dL (ref 6–24)
Bilirubin Total: 0.6 mg/dL (ref 0.0–1.2)
CO2: 24 mmol/L (ref 20–29)
Calcium: 10 mg/dL (ref 8.7–10.2)
Chloride: 97 mmol/L (ref 96–106)
Creatinine, Ser: 0.99 mg/dL (ref 0.57–1.00)
GFR calc Af Amer: 72 mL/min/{1.73_m2} (ref >59)
GFR calc non Af Amer: 63 mL/min/{1.73_m2} (ref >59)
Globulin, Total: 2.8 g/dL (ref 1.5–4.5)
Glucose: 92 mg/dL (ref 65–99)
Potassium: 3.7 mmol/L (ref 3.5–5.2)
Sodium: 139 mmol/L (ref 134–144)
Total Protein: 7.6 g/dL (ref 6.0–8.5)

## 2019-03-14 LAB — CBC
Hematocrit: 40.3 % (ref 34.0–46.6)
Hemoglobin: 13.9 g/dL (ref 11.1–15.9)
MCH: 27 pg (ref 26.6–33.0)
MCHC: 34.5 g/dL (ref 31.5–35.7)
MCV: 78 fL — ABNORMAL LOW (ref 79–97)
Platelets: 301 10*3/uL (ref 150–450)
RBC: 5.14 x10E6/uL (ref 3.77–5.28)
RDW: 13.1 % (ref 11.7–15.4)
WBC: 4.2 10*3/uL (ref 3.4–10.8)

## 2019-03-14 LAB — LIPID PANEL
Chol/HDL Ratio: 3.3 ratio (ref 0.0–4.4)
Cholesterol, Total: 209 mg/dL — ABNORMAL HIGH (ref 100–199)
HDL: 64 mg/dL (ref >39)
LDL Calculated: 131 mg/dL — ABNORMAL HIGH (ref 0–99)
Triglycerides: 71 mg/dL (ref 0–149)
VLDL Cholesterol Cal: 14 mg/dL (ref 5–40)

## 2019-03-14 LAB — HEMOGLOBIN A1C
Est. average glucose Bld gHb Est-mCnc: 123 mg/dL
Hgb A1c MFr Bld: 5.9 % — ABNORMAL HIGH (ref 4.8–5.6)

## 2019-03-14 LAB — HEPATITIS C ANTIBODY: Hep C Virus Ab: <0.1 s/co ratio (ref 0.0–0.9)

## 2019-03-30 ENCOUNTER — Telehealth: Payer: Self-pay

## 2019-03-30 NOTE — Telephone Encounter (Signed)
-----   Message from Arnette Felts, FNP sent at 03/29/2019 11:35 PM EDT ----- Blood levels are essentially normal, better than your previous labs.  Kidney functions and liver functions are normal.  Total cholesterol is improved down to 209 from 235 and LDL is down to 131 from 149. HgbA1c is stable at 5.9. Hepatitis c is negative.

## 2019-04-10 MED FILL — HYDROCHLOROTHIAZIDE 12.5 MG: 12.5 | 30 days supply | Qty: 30 | Fill #0

## 2019-04-10 MED FILL — AMLODIPINE BESYLATE 5 MG TA: 5 | 30 days supply | Qty: 30 | Fill #0

## 2019-04-17 ENCOUNTER — Telehealth: Payer: Self-pay

## 2019-04-17 NOTE — Telephone Encounter (Signed)
Pt has been called to reschedule cancelled appointment due to COVID 19. Pt is not wanting to schedule at this time. Pt is wanting to see PCP again to f/u first. Pt was informed to call back when ready to reschedule sleep study.

## 2019-05-05 MED FILL — HYDROCHLOROTHIAZIDE 12.5 MG: 12.5 | 30 days supply | Qty: 30 | Fill #1

## 2019-05-05 MED FILL — AMLODIPINE BESYLATE 5 MG TA: 5 | 90 days supply | Qty: 90 | Fill #1

## 2019-06-12 ENCOUNTER — Ambulatory Visit: Payer: 59 | Admitting: Nurse Practitioner

## 2019-06-13 ENCOUNTER — Other Ambulatory Visit: Payer: Self-pay

## 2019-06-13 ENCOUNTER — Ambulatory Visit: Payer: 59 | Admitting: Nurse Practitioner

## 2019-06-13 ENCOUNTER — Encounter: Payer: Self-pay | Admitting: Nurse Practitioner

## 2019-06-13 VITALS — BP 116/80 | HR 68 | Temp 98.1°F | Ht 60.6 in | Wt 188.2 lb

## 2019-06-13 DIAGNOSIS — E782 Mixed hyperlipidemia: Secondary | ICD-10-CM

## 2019-06-13 DIAGNOSIS — I10 Essential (primary) hypertension: Secondary | ICD-10-CM

## 2019-06-13 DIAGNOSIS — R7303 Prediabetes: Secondary | ICD-10-CM | POA: Diagnosis not present

## 2019-06-13 MED ORDER — HYDROCHLOROTHIAZIDE 12.5 MG PO TABS: 12.5000 mg | ORAL_TABLET | Freq: Every day | ORAL | 2 refills | Status: DC

## 2019-06-13 MED FILL — HYDROCHLOROTHIAZIDE 12.5 MG: 12.5 | 30 days supply | Qty: 30 | Fill #0

## 2019-06-13 NOTE — Progress Notes (Signed)
Subjective:     Patient ID: Catherine Fritz , female    DOB: [birth year] , 58 y.o.   MRN: [id number]   Chief Complaint  Patient presents with  . Hypertension    HPI  Wt Readings from Last 3 Encounters: 06/13/19 : 188 lb 3.2 oz (85.4 kg) 03/13/19 : 191 lb (86.6 kg) 01/24/19 : 191 lb (86.6 kg)  She and her husband have been walking at least 12 miles per week.    Hypertension This is a chronic problem. The current episode started more than 1 year ago. The problem is unchanged. The problem is controlled. Pertinent negatives include no anxiety, chest pain, headaches, malaise/fatigue or palpitations. There are no associated agents to hypertension. Risk factors for coronary artery disease include obesity. Past treatments include calcium channel blockers. The current treatment provides significant improvement. There are no compliance problems.  There is no history of angina. Identifiable causes of hypertension include hyperparathyroidism. There is no history of chronic renal disease.     Past Medical History:  Diagnosis Date  . Hypertension      Family History  Problem Relation Age of Onset  . Breast cancer Paternal Aunt        in 39's  . Hypertension Mother   . Heart disease Father   . Cancer Other   . Diabetes Sister   . Diabetes Brother   . Kidney disease Brother      Current Outpatient Medications:  .  amLODipine (NORVASC) 5 MG tablet, Take 1 tablet (5 mg total) by mouth daily., Disp: 30 tablet, Rfl: 11 .  MULTIPLE VITAMIN PO, Take 1 tablet by mouth daily., Disp: , Rfl:  .  hydrochlorothiazide (HYDRODIURIL) 12.5 MG tablet, Take 1 tablet (12.5 mg total) by mouth daily., Disp: 30 tablet, Rfl: 2   Allergies  Allergen Reactions  . Codeine Rash     Review of Systems  Constitutional: Negative.  Negative for malaise/fatigue.  Respiratory: Negative.   Cardiovascular: Negative for chest pain, palpitations and leg swelling.  Neurological: Negative for dizziness and headaches.      Today's Vitals   06/13/19 1128  BP: 116/80  Pulse: 68  Temp: 98.1 F (36.7 C)  TempSrc: Oral  Weight: 188 lb 3.2 oz (85.4 kg)  Height: 5' 0.6" (1.539 m)  PainSc: 0-No pain   Body mass index is 36.03 kg/m.   Objective:  Physical Exam Constitutional:      Appearance: Normal appearance.  Cardiovascular:     Rate and Rhythm: Normal rate and regular rhythm.     Pulses: Normal pulses.     Heart sounds: Normal heart sounds. No murmur.  Pulmonary:     Effort: Pulmonary effort is normal. No respiratory distress.     Breath sounds: Normal breath sounds.  Skin:    Capillary Refill: Capillary refill takes less than 2 seconds.  Neurological:     General: No focal deficit present.     Mental Status: She is alert and oriented to person, place, and time.         Assessment And Plan:     1. Essential hypertension . B/P is good control . CMP ordered to check renal function.  . The importance of regular exercise and dietary modification was stressed to the patient.  . Stressed importance of losing ten percent of her body weight to help with B/P control.  - BMP8+eGFR  2. Prediabetes  Chronic, stable  No current medications  Encouraged to limit intake of sugary foods and  drinks  Encouraged to increase physical activity to 150 minutes per week  3. Mixed hyperlipidemia  Chronic, controlled  Continue with current medications    [name], FNP    THE PATIENT IS ENCOURAGED TO PRACTICE SOCIAL DISTANCING DUE TO THE COVID-19 PANDEMIC.

## 2019-07-04 DIAGNOSIS — Z1211 Encounter for screening for malignant neoplasm of colon: Secondary | ICD-10-CM | POA: Diagnosis not present

## 2019-07-04 DIAGNOSIS — K219 Gastro-esophageal reflux disease without esophagitis: Secondary | ICD-10-CM | POA: Diagnosis not present

## 2019-07-04 MED FILL — GAVILYTE-G SOLUTION: 236 | 1 days supply | Qty: 4000 | Fill #0

## 2019-07-12 ENCOUNTER — Other Ambulatory Visit: Payer: Self-pay

## 2019-07-12 ENCOUNTER — Telehealth: Payer: Self-pay

## 2019-07-12 DIAGNOSIS — I10 Essential (primary) hypertension: Secondary | ICD-10-CM

## 2019-07-12 MED ORDER — AMLODIPINE BESYLATE 5 MG PO TABS: ORAL_TABLET | ORAL | 1 refills | Status: DC

## 2019-07-12 MED FILL — AMLODIPINE BESYLATE 5 MG TA: 5 | 30 days supply | Qty: 60 | Fill #0

## 2019-07-12 MED FILL — HYDROCHLOROTHIAZIDE 12.5 MG: 12.5 | 30 days supply | Qty: 30 | Fill #1

## 2019-07-12 NOTE — Telephone Encounter (Signed)
Patient called requesting a refill on amlodipine she stated she is taking it twice now so the pharmacy wont refill it because it is too soon. I have sent over a new prescription. YRL,RMA

## 2019-08-14 ENCOUNTER — Encounter: Payer: Self-pay | Admitting: Nurse Practitioner

## 2019-08-14 DIAGNOSIS — Z1211 Encounter for screening for malignant neoplasm of colon: Secondary | ICD-10-CM | POA: Diagnosis not present

## 2019-08-14 LAB — HM COLONOSCOPY

## 2019-08-15 MED FILL — AMLODIPINE BESYLATE 5 MG TA: 5 | 30 days supply | Qty: 60 | Fill #1

## 2019-08-15 MED FILL — HYDROCHLOROTHIAZIDE 12.5 MG: 12.5 | 30 days supply | Qty: 30 | Fill #2

## 2019-09-19 ENCOUNTER — Other Ambulatory Visit: Payer: Self-pay

## 2019-09-19 ENCOUNTER — Encounter: Payer: Self-pay | Admitting: Nurse Practitioner

## 2019-09-19 ENCOUNTER — Ambulatory Visit: Payer: 59 | Admitting: Nurse Practitioner

## 2019-09-19 VITALS — BP 142/98 | HR 78 | Temp 98.5°F | Ht 60.8 in | Wt 188.6 lb

## 2019-09-19 DIAGNOSIS — R05 Cough: Secondary | ICD-10-CM | POA: Diagnosis not present

## 2019-09-19 DIAGNOSIS — R059 Cough, unspecified: Secondary | ICD-10-CM | POA: Insufficient documentation

## 2019-09-19 DIAGNOSIS — I10 Essential (primary) hypertension: Secondary | ICD-10-CM

## 2019-09-19 DIAGNOSIS — E782 Mixed hyperlipidemia: Secondary | ICD-10-CM | POA: Diagnosis not present

## 2019-09-19 DIAGNOSIS — R7303 Prediabetes: Secondary | ICD-10-CM | POA: Diagnosis not present

## 2019-09-19 MED ORDER — AMLODIPINE BESYLATE 5 MG PO TABS: 5.0000 mg | ORAL_TABLET | Freq: Every day | ORAL | 1 refills | Status: DC

## 2019-09-19 MED ORDER — HYDROCHLOROTHIAZIDE 12.5 MG PO TABS: 12.5000 mg | ORAL_TABLET | Freq: Every day | ORAL | 1 refills | Status: DC

## 2019-09-19 MED FILL — HYDROCHLOROTHIAZIDE 12.5 MG: 12.5 | 90 days supply | Qty: 90 | Fill #0

## 2019-09-19 MED FILL — AMLODIPINE BESYLATE 5 MG TA: 5 | 90 days supply | Qty: 90 | Fill #0

## 2019-09-19 NOTE — Progress Notes (Signed)
Subjective:     Patient ID: Catherine Fritz , female    DOB: 06/13/1961 , 58 y.o.   MRN: [id number]   Chief Complaint  Patient presents with  . Hypertension    HPI  Wt Readings from Last 3 Encounters: 09/19/19 : 188 lb 9.6 oz (85.5 kg) 06/13/19 : 188 lb 3.2 oz (85.4 kg) 03/13/19 : 191 lb (86.6 kg)   She is not walking as much and eating more bad foods   Hypertension This is a chronic problem. The current episode started more than 1 year ago. The problem is unchanged. The problem is controlled. Pertinent negatives include no anxiety, chest pain, headaches, malaise/fatigue, palpitations or peripheral edema. There are no associated agents to hypertension. Risk factors for coronary artery disease include obesity and sedentary lifestyle (in the last month not as active). Past treatments include calcium channel blockers. The current treatment provides significant improvement. There are no compliance problems.  There is no history of angina. Identifiable causes of hypertension include hyperparathyroidism. There is no history of chronic renal disease.     Past Medical History:  Diagnosis Date  . Hypertension      Family History  Problem Relation Age of Onset  . Breast cancer Paternal Aunt        in 39's  . Hypertension Mother   . Heart disease Father   . Cancer Other   . Diabetes Sister   . Diabetes Brother   . Kidney disease Brother      Current Outpatient Medications:  .  amLODipine (NORVASC) 5 MG tablet, Take one tablet twice daily, Disp: 60 tablet, Rfl: 1 .  MULTIPLE VITAMIN PO, Take 1 tablet by mouth daily., Disp: , Rfl:  .  hydrochlorothiazide (HYDRODIURIL) 12.5 MG tablet, Take 1 tablet (12.5 mg total) by mouth daily., Disp: 90 tablet, Rfl: 1   Allergies  Allergen Reactions  . Codeine Rash     Review of Systems  Constitutional: Negative.  Negative for malaise/fatigue.  Respiratory: Negative.   Cardiovascular: Negative for chest pain, palpitations and leg swelling.   Neurological: Negative for dizziness and headaches.  Psychiatric/Behavioral: Negative.      Today's Vitals   09/19/19 1002  BP: (!) 142/98  Pulse: 78  Temp: 98.5 F (36.9 C)  Weight: 188 lb 9.6 oz (85.5 kg)  Height: 5' 0.8" (1.544 m)  PainSc: 2   PainLoc: Head   Body mass index is 35.87 kg/m.   Objective:  Physical Exam Constitutional:      General: She is not in acute distress.    Appearance: Normal appearance. She is obese.  Cardiovascular:     Rate and Rhythm: Normal rate and regular rhythm.     Pulses: Normal pulses.     Heart sounds: Normal heart sounds. No murmur.  Pulmonary:     Effort: Pulmonary effort is normal. No respiratory distress.     Breath sounds: Normal breath sounds.  Skin:    Capillary Refill: Capillary refill takes less than 2 seconds.  Neurological:     General: No focal deficit present.     Mental Status: She is alert and oriented to person, place, and time.         Assessment And Plan:     1. Essential hypertension . B/P is fairly controlled today however she admits to not exercising as much and eating poorly . Encouraged to exercise more, she has signed up for swimming lessons . BMP ordered to check renal function.  - BMP8+eGFR -  amLODipine (NORVASC) 5 MG tablet; Take 1 tablet (5 mg total) by mouth daily. Take one tablet twice daily  Dispense: 90 tablet; Refill: 1  2. Prediabetes  Chronic, stable  Diet and exercise controlled  Encouraged to limit intake of sugary foods and drinks  Encouraged to increase physical activity to 150 minutes per week  3. Mixed hyperlipidemia  Chronic, controlled  Continue with current medications  4. Cough  Intermittent dry cough mostly in the morning.    Take nasacort before walking      [name], FNP    THE PATIENT IS ENCOURAGED TO PRACTICE SOCIAL DISTANCING DUE TO THE COVID-19 PANDEMIC.

## 2019-09-19 NOTE — Patient Instructions (Signed)
   Use nasacort as needed 2 sprays each nostril for allergies  Consider air filter in the home

## 2019-09-20 LAB — LIPID PANEL
Chol/HDL Ratio: 3.3 ratio (ref 0.0–4.4)
Cholesterol, Total: 217 mg/dL — ABNORMAL HIGH (ref 100–199)
HDL: 66 mg/dL (ref >39)
LDL Chol Calc (NIH): 139 mg/dL — ABNORMAL HIGH (ref 0–99)
Triglycerides: 71 mg/dL (ref 0–149)
VLDL Cholesterol Cal: 12 mg/dL (ref 5–40)

## 2019-09-20 LAB — HEMOGLOBIN A1C
Est. average glucose Bld gHb Est-mCnc: 117 mg/dL
Hgb A1c MFr Bld: 5.7 % — ABNORMAL HIGH (ref 4.8–5.6)

## 2019-09-20 LAB — BMP8+EGFR
BUN/Creatinine Ratio: 20 (ref 12–28)
BUN: 18 mg/dL (ref 8–27)
CO2: 25 mmol/L (ref 20–29)
Calcium: 9.6 mg/dL (ref 8.7–10.3)
Chloride: 102 mmol/L (ref 96–106)
Creatinine, Ser: 0.91 mg/dL (ref 0.57–1.00)
GFR calc Af Amer: 79 mL/min/{1.73_m2} (ref >59)
GFR calc non Af Amer: 69 mL/min/{1.73_m2} (ref >59)
Glucose: 92 mg/dL (ref 65–99)
Potassium: 3.7 mmol/L (ref 3.5–5.2)
Sodium: 142 mmol/L (ref 134–144)

## 2019-09-21 ENCOUNTER — Encounter: Payer: Self-pay | Admitting: Nurse Practitioner

## 2019-10-31 MED FILL — AMLODIPINE BESYLATE 5 MG TA: 5 | 90 days supply | Qty: 90 | Fill #0

## 2019-12-12 MED FILL — HYDROCHLOROTHIAZIDE 12.5 MG: 12.5 | 90 days supply | Qty: 90 | Fill #1

## 2019-12-21 ENCOUNTER — Ambulatory Visit: Payer: 59 | Admitting: Nurse Practitioner

## 2019-12-21 ENCOUNTER — Telehealth (INDEPENDENT_AMBULATORY_CARE_PROVIDER_SITE_OTHER): Payer: 59 | Admitting: Nurse Practitioner

## 2019-12-21 ENCOUNTER — Other Ambulatory Visit: Payer: Self-pay

## 2019-12-21 VITALS — Ht 61.0 in | Wt 186.0 lb

## 2019-12-21 DIAGNOSIS — R7303 Prediabetes: Secondary | ICD-10-CM | POA: Diagnosis not present

## 2019-12-21 DIAGNOSIS — E782 Mixed hyperlipidemia: Secondary | ICD-10-CM | POA: Diagnosis not present

## 2019-12-21 DIAGNOSIS — I10 Essential (primary) hypertension: Secondary | ICD-10-CM | POA: Diagnosis not present

## 2019-12-21 NOTE — Progress Notes (Signed)
Virtual Visit via Video   This visit type was conducted due to national recommendations for restrictions regarding the COVID-19 Pandemic (e.g. social distancing) in an effort to limit this patient's exposure and mitigate transmission in our community.  Due to her co-morbid illnesses, this patient is at least at moderate risk for complications without adequate follow up.  This format is felt to be most appropriate for this patient at this time.  All issues noted in this document were discussed and addressed.  A limited physical exam was performed with this format.    This visit type was conducted due to national recommendations for restrictions regarding the COVID-19 Pandemic (e.g. social distancing) in an effort to limit this patient's exposure and mitigate transmission in our community.  Patients identity confirmed using two different identifiers.  This format is felt to be most appropriate for this patient at this time.  All issues noted in this document were discussed and addressed.  No physical exam was performed (except for noted visual exam findings with Video Visits).    Date:  12/21/2019   ID:  Catherine Fritz, DOB December 17, 1960, MRN [id number]  Patient Location:  Home - spoke with Arlina Robes  Provider location:   Office    Chief Complaint:  Hypertension follow up  History of Present Illness:    Catherine Fritz is a 59 y.o. female who presents via video conferencing for a telehealth visit today.    The patient does not have symptoms concerning for COVID-19 infection (fever, chills, cough, or new shortness of breath).   covid December 2020 she had Pfizer, 1st shot had sore arm 2nd January 19th - aching and drained  Hypertension This is a chronic problem. The current episode started more than 1 year ago. The problem has been gradually worsening since onset. Condition status: slightly elevated diastolic. Pertinent negatives include no anxiety, chest pain, headaches or shortness of  breath. Risk factors for coronary artery disease include sedentary lifestyle and obesity. Past treatments include calcium channel blockers and diuretics. The current treatment provides significant improvement. Compliance problems include exercise.  There is no history of kidney disease. There is no history of chronic renal disease.     Past Medical History:  Diagnosis Date  . Hypertension    Past Surgical History:  Procedure Laterality Date  . TUBAL LIGATION  1993     Current Meds  Medication Sig  . amLODipine (NORVASC) 5 MG tablet Take 1 tablet (5 mg total) by mouth daily.  . Cholecalciferol (VITAMIN D) 125 MCG (5000 UT) CAPS Take by mouth. Take one gummy daily  . hydrochlorothiazide (HYDRODIURIL) 12.5 MG tablet Take 1 tablet (12.5 mg total) by mouth daily.  . MULTIPLE VITAMIN PO Take 1 tablet by mouth daily.     Allergies:   Codeine   Social History   Tobacco Use  . Smoking status: Never Smoker  . Smokeless tobacco: Never Used  Substance Use Topics  . Alcohol use: No  . Drug use: Never     Family Hx: The patient's family history includes Breast cancer in her paternal aunt; Cancer in an other family member; Diabetes in her brother and sister; Heart disease in her father; Hypertension in her mother; Kidney disease in her brother.  ROS:   Please see the history of present illness.    Review of Systems  Constitutional: Negative.   Respiratory: Negative for cough, shortness of breath and wheezing.   Cardiovascular: Negative for chest pain.  Neurological: Negative for  dizziness and headaches.  Psychiatric/Behavioral: Negative.  Negative for depression.    All other systems reviewed and are negative.   Labs/Other Tests and Data Reviewed:    Recent Labs: 03/13/2019: ALT 23; Hemoglobin 13.9; Platelets 301 09/19/2019: BUN 18; Creatinine, Ser 0.91; Potassium 3.7; Sodium 142   Recent Lipid Panel Lab Results  Component Value Date/Time   CHOL 217 (H) 09/19/2019 02:00 PM    TRIG 71 09/19/2019 02:00 PM   HDL 66 09/19/2019 02:00 PM   CHOLHDL 3.3 09/19/2019 02:00 PM   LDLCALC 139 (H) 09/19/2019 02:00 PM    Wt Readings from Last 3 Encounters:  12/21/19 186 lb (84.4 kg)  09/19/19 188 lb 9.6 oz (85.5 kg)  06/13/19 188 lb 3.2 oz (85.4 kg)     Exam:    Vital Signs:  Ht 5\' 1"  (1.549 m)   Wt 186 lb (84.4 kg)   LMP 09/07/2012   BMI 35.14 kg/m     Physical Exam  Constitutional: She is oriented to person, place, and time and well-developed, well-nourished, and in no distress. No distress.  Pulmonary/Chest: Effort normal. No respiratory distress.  Neurological: She is alert and oriented to person, place, and time.  Psychiatric: Mood, memory, affect and judgment normal.    ASSESSMENT & PLAN:    1. Essential hypertension Chronic, she did not get her blood pressure she is at home and does not have a machine, will provide her with a prescription at next visit for blood pressure machine Continue with current medications Will check labs at next visit  2. Prediabetes Chronic, stable Continue to limit intake of sugary foods and drinks  3. Mixed hyperlipidemia Chronic, diet controlled    COVID-19 Education: The signs and symptoms of COVID-19 were discussed with the patient and how to seek care for testing (follow up with PCP or arrange E-visit).  The importance of social distancing was discussed today.  Patient Risk:   After full review of this patients clinical status, I feel that they are at least moderate risk at this time.  Time:   Today, I have spent 12 minutes/ seconds with the patient with telehealth technology discussing above diagnoses.     Medication Adjustments/Labs and Tests Ordered: Current medicines are reviewed at length with the patient today.  Concerns regarding medicines are outlined above.   Tests Ordered: No orders of the defined types were placed in this encounter.   Medication Changes: No orders of the defined types were  placed in this encounter.   Disposition:  Follow up in 3 month(s)  Signed, [name], FNP

## 2020-01-05 ENCOUNTER — Encounter: Payer: Self-pay | Admitting: Nurse Practitioner

## 2020-02-05 MED FILL — AMLODIPINE BESYLATE 5 MG TA: 5 | 90 days supply | Qty: 90 | Fill #1

## 2020-03-08 ENCOUNTER — Other Ambulatory Visit: Payer: Self-pay | Admitting: Nurse Practitioner

## 2020-03-08 DIAGNOSIS — I10 Essential (primary) hypertension: Secondary | ICD-10-CM

## 2020-03-11 MED FILL — HYDROCHLOROTHIAZIDE 12.5 MG: 12.5 | 90 days supply | Qty: 90 | Fill #0

## 2020-03-19 ENCOUNTER — Encounter: Payer: Self-pay | Admitting: Nurse Practitioner

## 2020-03-19 ENCOUNTER — Other Ambulatory Visit: Payer: Self-pay

## 2020-03-19 ENCOUNTER — Ambulatory Visit (INDEPENDENT_AMBULATORY_CARE_PROVIDER_SITE_OTHER): Payer: 59 | Admitting: Nurse Practitioner

## 2020-03-19 ENCOUNTER — Telehealth: Payer: Self-pay

## 2020-03-19 VITALS — BP 138/76 | HR 94 | Temp 98.4°F | Ht 61.0 in | Wt 198.2 lb

## 2020-03-19 DIAGNOSIS — Z23 Encounter for immunization: Secondary | ICD-10-CM

## 2020-03-19 DIAGNOSIS — Z0000 Encounter for general adult medical examination without abnormal findings: Secondary | ICD-10-CM

## 2020-03-19 DIAGNOSIS — E782 Mixed hyperlipidemia: Secondary | ICD-10-CM

## 2020-03-19 DIAGNOSIS — Z113 Encounter for screening for infections with a predominantly sexual mode of transmission: Secondary | ICD-10-CM

## 2020-03-19 DIAGNOSIS — R7303 Prediabetes: Secondary | ICD-10-CM | POA: Diagnosis not present

## 2020-03-19 DIAGNOSIS — Z1231 Encounter for screening mammogram for malignant neoplasm of breast: Secondary | ICD-10-CM | POA: Diagnosis not present

## 2020-03-19 DIAGNOSIS — I10 Essential (primary) hypertension: Secondary | ICD-10-CM | POA: Diagnosis not present

## 2020-03-19 DIAGNOSIS — Z6837 Body mass index (BMI) 37.0-37.9, adult: Secondary | ICD-10-CM

## 2020-03-19 LAB — POCT URINALYSIS DIPSTICK
Bilirubin, UA: NEGATIVE
Blood, UA: NEGATIVE
Glucose, UA: NEGATIVE
Ketones, UA: NEGATIVE
Nitrite, UA: NEGATIVE
Protein, UA: NEGATIVE
Spec Grav, UA: 1.015 (ref 1.010–1.025)
Urobilinogen, UA: 0.2 E.U./dL
pH, UA: 5.5 (ref 5.0–8.0)

## 2020-03-19 LAB — POCT UA - MICROALBUMIN
Albumin/Creatinine Ratio, Urine, POC: 30
Creatinine, POC: 100 mg/dL
Microalbumin Ur, POC: 30 mg/L

## 2020-03-19 MED ORDER — TETANUS-DIPHTH-ACELL PERTUSSIS 5-2.5-18.5 LF-MCG/0.5 IM SUSP
0.5000 mL | Freq: Once | INTRAMUSCULAR | Status: AC
  Administered 2020-03-19: 0.5 mL via INTRAMUSCULAR

## 2020-03-19 MED ORDER — SAXENDA 18 MG/3ML ~~LOC~~ SOPN: 3.0000 mg | PEN_INJECTOR | Freq: Every day | SUBCUTANEOUS | 1 refills | Status: DC

## 2020-03-19 NOTE — Progress Notes (Signed)
Subjective:     Patient ID: Catherine Fritz , female    DOB: 01-01-1961 , 59 y.o.   MRN: [id number]   Chief Complaint  Patient presents with  . Annual Exam   The patient states she uses post menopausal status for birth control.  Patient's last menstrual period was 09/07/2012.. Negative for Dysmenorrhea and Negative for Menorrhagia Mammogram last done 02/17/2019.  Negative for: breast discharge, breast lump(s), breast pain and breast self exam.  Pertinent negatives include abnormal bleeding (hematology), anxiety, decreased libido, depression, difficulty falling sleep, dyspareunia, history of infertility, nocturia, sexual dysfunction, sleep disturbances, urinary incontinence, urinary urgency, vaginal discharge and vaginal itching. Diet regular.The patient states her exercise level is walking 2-4 miles   The patient's tobacco use is:  Social History   Tobacco Use  Smoking Status Never Smoker  Smokeless Tobacco Never Used   She has been exposed to passive smoke. The patient's alcohol use is:  Social History   Substance and Sexual Activity  Alcohol Use No   Additional information: Last pap 2018, she would like to wait until next year.   HPI  Here for HM   Wt Readings from Last 3 Encounters: 03/19/20 : 198 lb 3.2 oz (89.9 kg) 12/21/19 : 186 lb (84.4 kg) 09/19/19 : 188 lb 9.6 oz (85.5 kg)   Hypertension This is a chronic problem. The current episode started more than 1 year ago. The problem has been gradually worsening since onset. The problem is controlled. Pertinent negatives include no anxiety, chest pain, headaches or shortness of breath. Risk factors for coronary artery disease include sedentary lifestyle and obesity. Past treatments include calcium channel blockers and diuretics. The current treatment provides significant improvement. Compliance problems include exercise.  There is no history of kidney disease. There is no history of chronic renal disease.     Past Medical  History:  Diagnosis Date  . Hypertension      Family History  Problem Relation Age of Onset  . Breast cancer Paternal Aunt        in 27's  . Hypertension Mother   . Heart disease Father   . Cancer Other   . Diabetes Sister   . Diabetes Brother   . Kidney disease Brother      Current Outpatient Medications:  .  amLODipine (NORVASC) 5 MG tablet, Take 1 tablet (5 mg total) by mouth daily., Disp: 90 tablet, Rfl: 1 .  Cholecalciferol (VITAMIN D) 125 MCG (5000 UT) CAPS, Take by mouth. Take one gummy daily, Disp: , Rfl:  .  hydrochlorothiazide (HYDRODIURIL) 12.5 MG tablet, TAKE 1 TABLET (12.5 MG TOTAL) BY MOUTH DAILY., Disp: 90 tablet, Rfl: 1 .  MULTIPLE VITAMIN PO, Take 1 tablet by mouth daily., Disp: , Rfl:    Allergies  Allergen Reactions  . Codeine Rash     Review of Systems  Constitutional: Negative.  Negative for fatigue.  HENT: Negative.   Eyes: Negative.   Respiratory: Negative.  Negative for shortness of breath.   Cardiovascular: Negative.  Negative for chest pain.  Gastrointestinal: Negative.   Endocrine: Negative.  Negative for polydipsia, polyphagia and polyuria.  Genitourinary: Negative.   Musculoskeletal: Negative.   Skin: Negative.   Allergic/Immunologic: Negative.   Neurological: Negative.  Negative for headaches.  Hematological: Negative.   Psychiatric/Behavioral: Negative.      Today's Vitals   03/19/20 0914  BP: 138/76  Pulse: 94  Temp: 98.4 F (36.9 C)  SpO2: 94%  Weight: 198 lb 3.2 oz (89.9 kg)  Height: 5\' 1"  (1.549 m)   Body mass index is 37.45 kg/m.   Objective:  Physical Exam Constitutional:      Appearance: Normal appearance. She is well-developed.  HENT:     Head: Normocephalic and atraumatic.     Right Ear: Hearing, tympanic membrane, ear canal and external ear normal.     Left Ear: Hearing, tympanic membrane, ear canal and external ear normal.     Nose: Nose normal.     Mouth/Throat:     Mouth: Mucous membranes are moist.   Eyes:     General: Lids are normal.     Conjunctiva/sclera: Conjunctivae normal.     Pupils: Pupils are equal, round, and reactive to light.     Funduscopic exam:    Right eye: No papilledema.        Left eye: No papilledema.  Neck:     Thyroid: No thyroid mass.     Vascular: No carotid bruit.  Cardiovascular:     Rate and Rhythm: Normal rate and regular rhythm.     Pulses: Normal pulses.     Heart sounds: Normal heart sounds. No murmur.  Pulmonary:     Effort: Pulmonary effort is normal.     Breath sounds: Normal breath sounds.  Abdominal:     General: Abdomen is flat. Bowel sounds are normal.     Palpations: Abdomen is soft.  Musculoskeletal:        General: No swelling. Normal range of motion.     Cervical back: Full passive range of motion without pain, normal range of motion and neck supple.     Right lower leg: No edema.     Left lower leg: No edema.  Skin:    General: Skin is warm and dry.     Capillary Refill: Capillary refill takes less than 2 seconds.  Neurological:     General: No focal deficit present.     Mental Status: She is alert and oriented to person, place, and time.     Cranial Nerves: No cranial nerve deficit.     Sensory: No sensory deficit.  Psychiatric:        Mood and Affect: Mood normal.        Behavior: Behavior normal.        Thought Content: Thought content normal.        Judgment: Judgment normal.         Assessment And Plan:     1. Health maintenance examination . Behavior modifications discussed and diet history reviewed.   . Pt will continue to exercise regularly and modify diet with low GI, plant based foods and decrease intake of processed foods.  . Recommend intake of daily multivitamin, Vitamin D, and calcium.  . Recommend mammogram for preventive screenings, as well as recommend immunizations that include influenza, TDAP  2. Screening for STD (sexually transmitted disease)   3. Essential hypertension . B/P is fairly  controlled.  . CMP ordered to check renal function.  . The importance of regular exercise and dietary modification was stressed to the patient.  - POCT Urinalysis Dipstick (81002) - POCT UA - Microalbumin - EKG 12-Lead  4. Prediabetes  Chronic, controlled  Continue with current medications  Encouraged to limit intake of sugary foods and drinks  Encouraged to increase physical activity to 150 minutes per week as tolerated  5. Mixed hyperlipidemia  Chronic, controlled  Continue with current medications          , FNP  THE PATIENT IS ENCOURAGED TO PRACTICE SOCIAL DISTANCING DUE TO THE COVID-19 PANDEMIC.   

## 2020-03-19 NOTE — Patient Instructions (Signed)
 Health Maintenance, Female Adopting a healthy lifestyle and getting preventive care are important in promoting health and wellness. Ask your health care provider about:  The right schedule for you to have regular tests and exams.  Things you can do on your own to prevent diseases and keep yourself healthy. What should I know about diet, weight, and exercise? Eat a healthy diet   Eat a diet that includes plenty of vegetables, fruits, low-fat dairy products, and lean protein.  Do not eat a lot of foods that are high in solid fats, added sugars, or sodium. Maintain a healthy weight Body mass index (BMI) is used to identify weight problems. It estimates body fat based on height and weight. Your health care provider can help determine your BMI and help you achieve or maintain a healthy weight. Get regular exercise Get regular exercise. This is one of the most important things you can do for your health. Most adults should:  Exercise for at least 150 minutes each week. The exercise should increase your heart rate and make you sweat (moderate-intensity exercise).  Do strengthening exercises at least twice a week. This is in addition to the moderate-intensity exercise.  Spend less time sitting. Even light physical activity can be beneficial. Watch cholesterol and blood lipids Have your blood tested for lipids and cholesterol at 61 years of age, then have this test every 5 years. Have your cholesterol levels checked more often if:  Your lipid or cholesterol levels are high.  You are older than 61 years of age.  You are at high risk for heart disease. What should I know about cancer screening? Depending on your health history and family history, you may need to have cancer screening at various ages. This may include screening for:  Breast cancer.  Cervical cancer.  Colorectal cancer.  Skin cancer.  Lung cancer. What should I know about heart disease, diabetes, and high blood  pressure? Blood pressure and heart disease  High blood pressure causes heart disease and increases the risk of stroke. This is more likely to develop in people who have high blood pressure readings, are of African descent, or are overweight.  Have your blood pressure checked: ? Every 3-5 years if you are 18-39 years of age. ? Every year if you are 40 years old or older. Diabetes Have regular diabetes screenings. This checks your fasting blood sugar level. Have the screening done:  Once every three years after age 40 if you are at a normal weight and have a low risk for diabetes.  More often and at a younger age if you are overweight or have a high risk for diabetes. What should I know about preventing infection? Hepatitis B If you have a higher risk for hepatitis B, you should be screened for this virus. Talk with your health care provider to find out if you are at risk for hepatitis B infection. Hepatitis C Testing is recommended for:  Everyone born from 1945 through 1965.  Anyone with known risk factors for hepatitis C. Sexually transmitted infections (STIs)  Get screened for STIs, including gonorrhea and chlamydia, if: ? You are sexually active and are younger than 61 years of age. ? You are older than 61 years of age and your health care provider tells you that you are at risk for this type of infection. ? Your sexual activity has changed since you were last screened, and you are at increased risk for chlamydia or gonorrhea. Ask your health care provider   if you are at risk.  Ask your health care provider about whether you are at high risk for HIV. Your health care provider may recommend a prescription medicine to help prevent HIV infection. If you choose to take medicine to prevent HIV, you should first get tested for HIV. You should then be tested every 3 months for as long as you are taking the medicine. Pregnancy  If you are about to stop having your period (premenopausal) and  you may become pregnant, seek counseling before you get pregnant.  Take 400 to 800 micrograms (mcg) of folic acid every day if you become pregnant.  Ask for birth control (contraception) if you want to prevent pregnancy. Osteoporosis and menopause Osteoporosis is a disease in which the bones lose minerals and strength with aging. This can result in bone fractures. If you are 65 years old or older, or if you are at risk for osteoporosis and fractures, ask your health care provider if you should:  Be screened for bone loss.  Take a calcium or vitamin D supplement to lower your risk of fractures.  Be given hormone replacement therapy (HRT) to treat symptoms of menopause. Follow these instructions at home: Lifestyle  Do not use any products that contain nicotine or tobacco, such as cigarettes, e-cigarettes, and chewing tobacco. If you need help quitting, ask your health care provider.  Do not use street drugs.  Do not share needles.  Ask your health care provider for help if you need support or information about quitting drugs. Alcohol use  Do not drink alcohol if: ? Your health care provider tells you not to drink. ? You are pregnant, may be pregnant, or are planning to become pregnant.  If you drink alcohol: ? Limit how much you use to 0-1 drink a day. ? Limit intake if you are breastfeeding.  Be aware of how much alcohol is in your drink. In the [place] one drink equals one 12 oz bottle of beer (355 mL), one 5 oz glass of wine (148 mL), or one 1 oz glass of hard liquor (44 mL). General instructions  Schedule regular health, dental, and eye exams.  Stay current with your vaccines.  Tell your health care provider if: ? You often feel depressed. ? You have ever been abused or do not feel safe at home. Summary  Adopting a healthy lifestyle and getting preventive care are important in promoting health and wellness.  Follow your health care provider's instructions about healthy  diet, exercising, and getting tested or screened for diseases.  Follow your health care provider's instructions on monitoring your cholesterol and blood pressure. This information is not intended to replace advice given to you by your health care provider. Make sure you discuss any questions you have with your health care provider. Document Revised: 10/19/2018 Document Reviewed: 10/19/2018 Elsevier Patient Education  2020 Elsevier Inc.   Fat and Cholesterol Restricted Eating Plan Eating a diet that limits fat and cholesterol may help lower your risk for heart disease and other conditions. Your body needs fat and cholesterol for basic functions, but eating too much of these things can be harmful to your health. Your health care provider may order lab tests to check your blood fat (lipid) and cholesterol levels. This helps your health care provider understand your risk for certain conditions and whether you need to make diet changes. Work with your health care provider or dietitian to make an eating plan that is right for you. Your plan includes:    Limit your fat intake to ______% or less of your total calories a day.  Limit your saturated fat intake to ______% or less of your total calories a day.  Limit the amount of cholesterol in your diet to less than _________mg a day.  Eat ___________ g of fiber a day. What are tips for following this plan? General guidelines   If you are overweight, work with your health care provider to lose weight safely. Losing just 5-10% of your body weight can improve your overall health and help prevent diseases such as diabetes and heart disease.  Avoid: ? Foods with added sugar. ? Fried foods. ? Foods that contain partially hydrogenated oils, including stick margarine, some tub margarines, cookies, crackers, and other baked goods.  Limit alcohol intake to no more than 1 drink a day for nonpregnant women and 2 drinks a day for men. One drink equals 12 oz  of beer, 5 oz of wine, or 1 oz of hard liquor. Reading food labels  Check food labels for: ? Trans fats, partially hydrogenated oils, or high amounts of saturated fat. Avoid foods that contain saturated fat and trans fat. ? The amount of cholesterol in each serving. Try to eat no more than 200 mg of cholesterol each day. ? The amount of fiber in each serving. Try to eat at least 20-30 g of fiber each day.  Choose foods with healthy fats, such as: ? Monounsaturated and polyunsaturated fats. These include olive and canola oil, flaxseeds, walnuts, almonds, and seeds. ? Omega-3 fats. These are found in foods such as salmon, mackerel, sardines, tuna, flaxseed oil, and ground flaxseeds.  Choose grain products that have whole grains. Look for the word "whole" as the first word in the ingredient list. Cooking  Cook foods using methods other than frying. Baking, boiling, grilling, and broiling are some healthy options.  Eat more home-cooked food and less restaurant, buffet, and fast food.  Avoid cooking using saturated fats. ? Animal sources of saturated fats include meats, butter, and cream. ? Plant sources of saturated fats include palm oil, palm kernel oil, and coconut oil. Meal planning   At meals, imagine dividing your plate into fourths: ? Fill one-half of your plate with vegetables and green salads. ? Fill one-fourth of your plate with whole grains. ? Fill one-fourth of your plate with lean protein foods.  Eat fish that is high in omega-3 fats at least two times a week.  Eat more foods that contain fiber, such as whole grains, beans, apples, broccoli, carrots, peas, and barley. These foods help promote healthy cholesterol levels in the blood. Recommended foods Grains  Whole grains, such as whole wheat or whole grain breads, crackers, cereals, and pasta. Unsweetened oatmeal, bulgur, barley, quinoa, or brown rice. Corn or whole wheat flour tortillas. Vegetables  Fresh or frozen  vegetables (raw, steamed, roasted, or grilled). Green salads. Fruits  All fresh, canned (in natural juice), or frozen fruits. Meats and other protein foods  Ground beef (85% or leaner), grass-fed beef, or beef trimmed of fat. Skinless chicken or turkey. Ground chicken or turkey. Pork trimmed of fat. All fish and seafood. Egg whites. Dried beans, peas, or lentils. Unsalted nuts or seeds. Unsalted canned beans. Natural nut butters without added sugar and oil. Dairy  Low-fat or nonfat dairy products, such as skim or 1% milk, 2% or reduced-fat cheeses, low-fat and fat-free ricotta or cottage cheese, or plain low-fat and nonfat yogurt. Fats and oils  Tub margarine without trans fats.   Light or reduced-fat mayonnaise and salad dressings. Avocado. Olive, canola, sesame, or safflower oils. The items listed above may not be a complete list of recommended foods or beverages. Contact your dietitian for more options. Foods to avoid Grains  White bread. White pasta. White rice. Cornbread. Bagels, pastries, and croissants. Crackers and snack foods that contain trans fat and hydrogenated oils. Vegetables  Vegetables cooked in cheese, cream, or butter sauce. Fried vegetables. Fruits  Canned fruit in heavy syrup. Fruit in cream or butter sauce. Fried fruit. Meats and other protein foods  Fatty cuts of meat. Ribs, chicken wings, bacon, sausage, bologna, salami, chitterlings, fatback, hot dogs, bratwurst, and packaged lunch meats. Liver and organ meats. Whole eggs and egg yolks. Chicken and turkey with skin. Fried meat. Dairy  Whole or 2% milk, cream, half-and-half, and cream cheese. Whole milk cheeses. Whole-fat or sweetened yogurt. Full-fat cheeses. Nondairy creamers and whipped toppings. Processed cheese, cheese spreads, and cheese curds. Beverages  Alcohol. Sugar-sweetened drinks such as sodas, lemonade, and fruit drinks. Fats and oils  Butter, stick margarine, lard, shortening, ghee, or bacon  fat. Coconut, palm kernel, and palm oils. Sweets and desserts  Corn syrup, sugars, honey, and molasses. Candy. Jam and jelly. Syrup. Sweetened cereals. Cookies, pies, cakes, donuts, muffins, and ice cream. The items listed above may not be a complete list of foods and beverages to avoid. Contact your dietitian for more information. Summary  Your body needs fat and cholesterol for basic functions. However, eating too much of these things can be harmful to your health.  Work with your health care provider and dietitian to follow a diet low in fat and cholesterol. Doing this may help lower your risk for heart disease and other conditions.  Choose healthy fats, such as monounsaturated and polyunsaturated fats, and foods high in omega-3 fatty acids.  Eat fiber-rich foods, such as whole grains, beans, peas, fruits, and vegetables.  Limit or avoid alcohol, fried foods, and foods high in saturated fats, partially hydrogenated oils, and sugar. This information is not intended to replace advice given to you by your health care provider. Make sure you discuss any questions you have with your health care provider. Document Revised: 10/08/2017 Document Reviewed: 07/13/2017 Elsevier Patient Education  2020 Elsevier Inc.  American Heart Association (AHA) Exercise Recommendation  Being physically active is important to prevent heart disease and stroke, the nation's No. 1and No. 5killers. To improve overall cardiovascular health, we suggest at least 150 minutes per week of moderate exercise or 75 minutes per week of vigorous exercise (or a combination of moderate and vigorous activity). Thirty minutes a day, five times a week is an easy goal to remember. You will also experience benefits even if you divide your time into two or three segments of 10 to 15 minutes per day.  For people who would benefit from lowering their blood pressure or cholesterol, we recommend 40 minutes of aerobic exercise of moderate to  vigorous intensity three to four times a week to lower the risk for heart attack and stroke.  Physical activity is anything that makes you move your body and burn calories.  This includes things like climbing stairs or playing sports. Aerobic exercises benefit your heart, and include walking, jogging, swimming or biking. Strength and stretching exercises are best for overall stamina and flexibility.  The simplest, positive change you can make to effectively improve your heart health is to start walking. It's enjoyable, free, easy, social and great exercise. A walking program is flexible   and boasts high success rates because people can stick with it. It's easy for walking to become a regular and satisfying part of life.   For Overall Cardiovascular Health:  At least 30 minutes of moderate-intensity aerobic activity at least 5 days per week for a total of 150  OR   At least 25 minutes of vigorous aerobic activity at least 3 days per week for a total of 75 minutes; or a combination of moderate- and vigorous-intensity aerobic activity  AND   Moderate- to high-intensity muscle-strengthening activity at least 2 days per week for additional health benefits.  For Lowering Blood Pressure and Cholesterol  An average 40 minutes of moderate- to vigorous-intensity aerobic activity 3 or 4 times per week  What if I can't make it to the time goal? Something is always better than nothing! And everyone has to start somewhere. Even if you've been sedentary for years, today is the day you can begin to make healthy changes in your life. If you don't think you'll make it for 30 or 40 minutes, set a reachable goal for today. You can work up toward your overall goal by increasing your time as you get stronger. Don't let all-or-nothing thinking rob you of doing what you can every day.  Source:http://www.heart.org    

## 2020-03-19 NOTE — Telephone Encounter (Signed)
PA for Bernie Covey has been submitted through [email or web address] we are waiting for the determination. YL,RMA

## 2020-03-20 LAB — COMPREHENSIVE METABOLIC PANEL
ALT: 38 IU/L — ABNORMAL HIGH (ref 0–32)
AST: 26 IU/L (ref 0–40)
Albumin/Globulin Ratio: 1.6 (ref 1.2–2.2)
Albumin: 4.6 g/dL (ref 3.8–4.9)
Alkaline Phosphatase: 96 IU/L (ref 39–117)
BUN/Creatinine Ratio: 18 (ref 12–28)
BUN: 17 mg/dL (ref 8–27)
Bilirubin Total: 0.5 mg/dL (ref 0.0–1.2)
CO2: 25 mmol/L (ref 20–29)
Calcium: 10.1 mg/dL (ref 8.7–10.3)
Chloride: 100 mmol/L (ref 96–106)
Creatinine, Ser: 0.92 mg/dL (ref 0.57–1.00)
GFR calc Af Amer: 78 mL/min/{1.73_m2} (ref >59)
GFR calc non Af Amer: 68 mL/min/{1.73_m2} (ref >59)
Globulin, Total: 2.8 g/dL (ref 1.5–4.5)
Glucose: 100 mg/dL — ABNORMAL HIGH (ref 65–99)
Potassium: 4.1 mmol/L (ref 3.5–5.2)
Sodium: 138 mmol/L (ref 134–144)
Total Protein: 7.4 g/dL (ref 6.0–8.5)

## 2020-03-20 LAB — CBC
Hematocrit: 39.6 % (ref 34.0–46.6)
Hemoglobin: 13.6 g/dL (ref 11.1–15.9)
MCH: 26.9 pg (ref 26.6–33.0)
MCHC: 34.3 g/dL (ref 31.5–35.7)
MCV: 78 fL — ABNORMAL LOW (ref 79–97)
Platelets: 283 10*3/uL (ref 150–450)
RBC: 5.06 x10E6/uL (ref 3.77–5.28)
RDW: 14.1 % (ref 11.7–15.4)
WBC: 4.7 10*3/uL (ref 3.4–10.8)

## 2020-03-20 LAB — LIPID PANEL
Chol/HDL Ratio: 3.8 ratio (ref 0.0–4.4)
Cholesterol, Total: 222 mg/dL — ABNORMAL HIGH (ref 100–199)
HDL: 58 mg/dL (ref >39)
LDL Chol Calc (NIH): 148 mg/dL — ABNORMAL HIGH (ref 0–99)
Triglycerides: 91 mg/dL (ref 0–149)
VLDL Cholesterol Cal: 16 mg/dL (ref 5–40)

## 2020-03-20 LAB — HEMOGLOBIN A1C
Est. average glucose Bld gHb Est-mCnc: 126 mg/dL
Hgb A1c MFr Bld: 6 % — ABNORMAL HIGH (ref 4.8–5.6)

## 2020-03-20 LAB — HIV ANTIBODY (ROUTINE TESTING W REFLEX): HIV Screen 4th Generation wRfx: NONREACTIVE

## 2020-04-01 ENCOUNTER — Other Ambulatory Visit: Payer: Self-pay

## 2020-04-01 ENCOUNTER — Ambulatory Visit
Admission: RE | Admit: 2020-04-01 | Discharge: 2020-04-01 | Disposition: A | Discharge status: Home / Self Care | Payer: 59 | Source: Ambulatory Visit | Attending: Nurse Practitioner | Admitting: Nurse Practitioner

## 2020-04-01 DIAGNOSIS — Z1231 Encounter for screening mammogram for malignant neoplasm of breast: Secondary | ICD-10-CM

## 2020-04-02 ENCOUNTER — Telehealth: Payer: Self-pay

## 2020-04-02 ENCOUNTER — Other Ambulatory Visit: Payer: Self-pay | Admitting: Nurse Practitioner

## 2020-04-02 MED ORDER — NALTREXONE-BUPROPION HCL ER 8-90 MG PO TB12: ORAL_TABLET | ORAL | 1 refills | Status: DC

## 2020-04-02 NOTE — Telephone Encounter (Signed)
PT CALLED WANTED TO CHECK STATUS OF SAXENDA PA ADVISED PT THAT IT HAS BEEN DENIED, PROVIDER STATED THAT SHE CAN TRY CONTRAVE, PT IS WILLING PA HAS BEEN STARTED KEY#[id number]

## 2020-04-09 ENCOUNTER — Telehealth: Payer: Self-pay

## 2020-04-09 NOTE — Telephone Encounter (Signed)
PA has been approved for contrave 8-90mg  through 04/04/20-08/04/20. Pt and pharmacy have both been made aware. YL,RMA

## 2020-04-10 MED FILL — CONTRAVE ER 8-90 MG TABLET: 8-90 | 47 days supply | Qty: 120 | Fill #0

## 2020-04-16 ENCOUNTER — Encounter: Payer: Self-pay | Admitting: Nurse Practitioner

## 2020-05-07 ENCOUNTER — Other Ambulatory Visit: Payer: Self-pay | Admitting: Nurse Practitioner

## 2020-05-07 DIAGNOSIS — I10 Essential (primary) hypertension: Secondary | ICD-10-CM

## 2020-05-07 MED FILL — AMLODIPINE BESYLATE 5 MG TA: 5 | 90 days supply | Qty: 90 | Fill #0

## 2020-05-21 ENCOUNTER — Encounter: Payer: Self-pay | Admitting: Nurse Practitioner

## 2020-05-21 ENCOUNTER — Other Ambulatory Visit: Payer: Self-pay

## 2020-05-21 ENCOUNTER — Ambulatory Visit: Payer: 59 | Admitting: Nurse Practitioner

## 2020-05-21 VITALS — BP 122/78 | HR 94 | Temp 98.3°F | Ht 60.6 in | Wt 190.4 lb

## 2020-05-21 DIAGNOSIS — R7303 Prediabetes: Secondary | ICD-10-CM | POA: Diagnosis not present

## 2020-05-21 DIAGNOSIS — R0789 Other chest pain: Secondary | ICD-10-CM | POA: Diagnosis not present

## 2020-05-21 DIAGNOSIS — I10 Essential (primary) hypertension: Secondary | ICD-10-CM | POA: Diagnosis not present

## 2020-05-21 DIAGNOSIS — Z6836 Body mass index (BMI) 36.0-36.9, adult: Secondary | ICD-10-CM

## 2020-05-21 NOTE — Progress Notes (Signed)
I,Yamilka Roman [employer] as a Education administrator for [employer], FNP.,have documented all relevant documentation on the behalf of [name], FNP,as directed by  [name], FNP while in the presence of [name], [place].  This visit occurred during the SARS-CoV-2 public health emergency.  Safety protocols were in place, including screening questions prior to the visit, additional usage of staff PPE, and extensive cleaning of exam room while observing appropriate contact time as indicated for disinfecting solutions.  Subjective:     Patient ID: Catherine Fritz , female    DOB: Jul 19, 1961 , 59 y.o.   MRN: [id number]   Chief Complaint  Patient presents with  . Hypertension  . Weight Check    patient stated when she increased her dose of contrave she broke out in hives     HPI  Patient here for weight check currently taking contrave. Last dose was a week or two ago. She complains of rash after taking evening dose. Rash located on her legs,waist,arms and between her fingers. She stopped taking the med after rash appeared. Pt denied trying metformin due to sister having reaction.  She has taken Saxenda in the past without any issues, was stopped due to coverage. She is being more mindful with her diet, eating more vegetables, cut back on snacking. Patient walking 3-4 times a week for 45 minutes she tries to walk at least 3 miles. She complains of pain in her chest. She stated the pressure from her clothes even bother her when she is having an episode that last about 10 minutes. Pressure went away on its own. She stated she feels like something was squeezing the middle of her chest. Eating or belching helped ease the pain. She has tried taking mylinta and that helped. Patient is recommended to go to ER if episode happens again.   Wt Readings from Last 3 Encounters: 05/21/20 : 190 lb 6.4 oz (86.4 kg) 03/19/20 : 198 lb 3.2 oz (89.9 kg) 12/21/19 : 186 lb (84.4 kg)  Hypertension This is a chronic  problem. The current episode started more than 1 year ago. The problem is controlled. Pertinent negatives include no chest pain or malaise/fatigue. Risk factors for coronary artery disease include obesity and family history. Past treatments include calcium channel blockers and diuretics. The current treatment provides significant improvement. There are no compliance problems.      Past Medical History:  Diagnosis Date  . Hypertension      Family History  Problem Relation Age of Onset  . Breast cancer Paternal Aunt        in 49's  . Hypertension Mother   . Thyroid disease Mother   . Heart disease Father   . Cancer Other   . Diabetes Sister   . Thyroid disease Sister   . Diabetes Brother   . Kidney disease Brother   . Thyroid disease Brother      Current Outpatient Medications:  .  amLODipine (NORVASC) 5 MG tablet, TAKE 1 TABLET (5 MG TOTAL) BY MOUTH DAILY., Disp: 90 tablet, Rfl: 1 .  Cholecalciferol (VITAMIN D) 125 MCG (5000 UT) CAPS, Take by mouth. Take one gummy daily, Disp: , Rfl:  .  hydrochlorothiazide (HYDRODIURIL) 12.5 MG tablet, TAKE 1 TABLET (12.5 MG TOTAL) BY MOUTH DAILY., Disp: 90 tablet, Rfl: 1 .  MULTIPLE VITAMIN PO, Take 1 tablet by mouth daily., Disp: , Rfl:    Allergies  Allergen Reactions  . Codeine Rash  . Contrave [Naltrexone-Bupropion Hcl Er] Rash  Review of Systems  Constitutional: Negative.  Negative for malaise/fatigue.  Respiratory: Negative.   Cardiovascular: Negative.  Negative for chest pain.  Neurological: Negative.   Psychiatric/Behavioral: Negative.      Today's Vitals   05/21/20 1411  BP: 122/78  Pulse: 94  Temp: 98.3 F (36.8 C)  TempSrc: Oral  Weight: 190 lb 6.4 oz (86.4 kg)  Height: 5' 0.6" (1.539 m)  PainSc: 0-No pain   Body mass index is 36.45 kg/m.   Objective:  Physical Exam Constitutional:      General: She is not in acute distress.    Appearance: Normal appearance. She is obese.  Eyes:     Pupils: Pupils are  equal, round, and reactive to light.  Cardiovascular:     Rate and Rhythm: Normal rate and regular rhythm.     Pulses: Normal pulses.     Heart sounds: Normal heart sounds. No murmur heard.   Pulmonary:     Effort: Pulmonary effort is normal. No respiratory distress.     Breath sounds: Normal breath sounds.  Musculoskeletal:     Right lower leg: No edema.     Left lower leg: No edema.     Comments: No tenderness to chest wall  Skin:    General: Skin is warm and dry.     Capillary Refill: Capillary refill takes less than 2 seconds.  Neurological:     General: No focal deficit present.     Mental Status: She is alert and oriented to person, place, and time.     Cranial Nerves: No cranial nerve deficit.  Psychiatric:        Mood and Affect: Mood normal.        Behavior: Behavior normal.        Thought Content: Thought content normal.        Judgment: Judgment normal.         Assessment And Plan:     1. Essential hypertension Chronic, good control Continue with current medications - BMP8+EGFR  2. Class 2 severe obesity due to excess calories with serious comorbidity and body mass index (BMI) of 36.0 to 36.9 in adult [facility]) Chronic, she was unable to tolerate contrave and may have had an allergic reaction to the medication with a rash At this time will stop and start on [facility] pending insurance approval Sample given in office, she has taken [place] in the past but was not covered by insurance.  3. Prediabetes Chronic, stable Pending results will consider a medication for prediabetes - Hemoglobin A1c  4. Chest discomfort EKG done with NSR HR 64 I have advised her in the future if has chest pressure to go to ER for evaluation Avoid fried and fatty foods. - EKG 12-Lead       [name], FNP   I, [name], FNP, have reviewed all documentation for this visit. The documentation on 05/21/20 for the exam, diagnosis, procedures, and orders are all accurate and  complete.  THE PATIENT IS ENCOURAGED TO PRACTICE SOCIAL DISTANCING DUE TO THE COVID-19 PANDEMIC.

## 2020-05-21 NOTE — Patient Instructions (Signed)

## 2020-05-22 LAB — BMP8+EGFR
BUN/Creatinine Ratio: 14 (ref 12–28)
BUN: 15 mg/dL (ref 8–27)
CO2: 25 mmol/L (ref 20–29)
Calcium: 9.6 mg/dL (ref 8.7–10.3)
Chloride: 98 mmol/L (ref 96–106)
Creatinine, Ser: 1.05 mg/dL — ABNORMAL HIGH (ref 0.57–1.00)
GFR calc Af Amer: 66 mL/min/{1.73_m2} (ref >59)
GFR calc non Af Amer: 57 mL/min/{1.73_m2} — ABNORMAL LOW (ref >59)
Glucose: 71 mg/dL (ref 65–99)
Potassium: 3.6 mmol/L (ref 3.5–5.2)
Sodium: 139 mmol/L (ref 134–144)

## 2020-05-22 LAB — HEMOGLOBIN A1C
Est. average glucose Bld gHb Est-mCnc: 120 mg/dL
Hgb A1c MFr Bld: 5.8 % — ABNORMAL HIGH (ref 4.8–5.6)

## 2020-06-14 MED FILL — HYDROCHLOROTHIAZIDE 12.5 MG: 12.5 | 90 days supply | Qty: 90 | Fill #1

## 2020-06-24 ENCOUNTER — Other Ambulatory Visit: Payer: Self-pay

## 2020-06-24 MED ORDER — WEGOVY 0.5 MG/0.5ML ~~LOC~~ SOAJ: 0.5000 mg | SUBCUTANEOUS | 0 refills | Status: DC

## 2020-06-24 MED FILL — WEGOVY 0.5 MG/0.5ML SOAJ: 0.5 | 28 days supply | Qty: 2 | Fill #0

## 2020-07-11 DIAGNOSIS — H5203 Hypermetropia, bilateral: Secondary | ICD-10-CM | POA: Diagnosis not present

## 2020-07-16 ENCOUNTER — Other Ambulatory Visit: Payer: Self-pay | Admitting: Nurse Practitioner

## 2020-07-19 MED FILL — WEGOVY 0.5 MG/0.5ML SOAJ: 0.5 | 28 days supply | Qty: 2 | Fill #0

## 2020-07-23 ENCOUNTER — Encounter: Payer: Self-pay | Admitting: Nurse Practitioner

## 2020-07-23 ENCOUNTER — Other Ambulatory Visit: Payer: Self-pay

## 2020-07-23 ENCOUNTER — Ambulatory Visit: Payer: 59 | Admitting: Nurse Practitioner

## 2020-07-23 VITALS — BP 138/86 | HR 84 | Temp 98.1°F | Ht 60.6 in | Wt 183.2 lb

## 2020-07-23 DIAGNOSIS — Z6835 Body mass index (BMI) 35.0-35.9, adult: Secondary | ICD-10-CM | POA: Diagnosis not present

## 2020-07-23 DIAGNOSIS — E6609 Other obesity due to excess calories: Secondary | ICD-10-CM | POA: Diagnosis not present

## 2020-07-23 MED ORDER — WEGOVY 1 MG/0.5ML ~~LOC~~ SOAJ: 1.0000 mg | SUBCUTANEOUS | 0 refills | Status: DC

## 2020-07-23 NOTE — Progress Notes (Signed)
I,Yamilka Roman [employer] as a Neurosurgeon for [employer], FNP.,have documented all relevant documentation on the behalf of Arnette Felts, FNP,as directed by  Arnette Felts, FNP while in the presence of Arnette Felts, FNP.  This visit occurred during the SARS-CoV-2 public health emergency.  Safety protocols were in place, including screening questions prior to the visit, additional usage of staff PPE, and extensive cleaning of exam room while observing appropriate contact time as indicated for disinfecting solutions.  Subjective:     Patient ID: Catherine Fritz , female    DOB: 1961-01-21 , 59 y.o.   MRN: [id number]   Chief Complaint  Patient presents with  . Weight Check    HPI  Here for weight check.    Wt Readings from Last 3 Encounters: 07/23/20 : 183 lb 3.2 oz (83.1 kg) 05/21/20 : 190 lb 6.4 oz (86.4 kg) 03/19/20 : 198 lb 3.2 oz (89.9 kg)  She is tolerating Wegovy well. She is exercising (walking) 3-4 times a week 150 minutes.  She is trying to eat more fruits and vegetables. She will eat chicken. Avoiding pork.  Continues to eat pasta, cutting back on cheese.  She has decreased her intake of breads. Will eat cookies, cakes and sweets unless has a craving.  She can tell her clothes are more loose. On Saturday will incorporate some weights.     Past Medical History:  Diagnosis Date  . Hypertension      Family History  Problem Relation Age of Onset  . Breast cancer Paternal Aunt        in 48's  . Hypertension Mother   . Thyroid disease Mother   . Heart disease Father   . Cancer Other   . Diabetes Sister   . Thyroid disease Sister   . Diabetes Brother   . Kidney disease Brother   . Thyroid disease Brother      Current Outpatient Medications:  .  amLODipine (NORVASC) 5 MG tablet, TAKE 1 TABLET (5 MG TOTAL) BY MOUTH DAILY., Disp: 90 tablet, Rfl: 1 .  Cholecalciferol (VITAMIN D) 125 MCG (5000 UT) CAPS, Take by mouth. Take one gummy daily, Disp: , Rfl:  .   hydrochlorothiazide (HYDRODIURIL) 12.5 MG tablet, TAKE 1 TABLET (12.5 MG TOTAL) BY MOUTH DAILY., Disp: 90 tablet, Rfl: 1 .  MULTIPLE VITAMIN PO, Take 1 tablet by mouth daily., Disp: , Rfl:  .  Semaglutide-Weight Management (WEGOVY) 1 MG/0.5ML SOAJ, Inject 1 mg into the skin once a week., Disp: 2 mL, Rfl: 0   Allergies  Allergen Reactions  . Codeine Rash  . Contrave [Naltrexone-Bupropion Hcl Er] Rash     Review of Systems  Constitutional: Negative.   Respiratory: Negative.  Negative for cough.   Cardiovascular: Negative.  Negative for chest pain, palpitations and leg swelling.  Neurological: Negative for dizziness and headaches.  Psychiatric/Behavioral: Negative.      Today's Vitals   07/23/20 0951  BP: 138/86  Pulse: 84  Temp: 98.1 F (36.7 C)  Weight: 183 lb 3.2 oz (83.1 kg)  Height: 5' 0.6" (1.539 m)  PainSc: 0-No pain   Body mass index is 35.07 kg/m.   Objective:  Physical Exam Constitutional:      General: She is not in acute distress.    Appearance: Normal appearance. She is obese.  Pulmonary:     Effort: Pulmonary effort is normal. No respiratory distress.  Neurological:     General: No focal deficit present.     Mental Status: She is  alert and oriented to person, place, and time.     Cranial Nerves: No cranial nerve deficit.  Psychiatric:        Mood and Affect: Mood normal.        Behavior: Behavior normal.        Thought Content: Thought content normal.        Judgment: Judgment normal.         Assessment And Plan:     1. Class 2 obesity due to excess calories without serious comorbidity with body mass index (BMI) of 35.0 to 35.9 in adult  She has lost 8 lbs since her last visit and is tolerating wegovy well.   Continue with wegovy, we may need to keep at same dose if higher dose is unavailable.    Continue with healthy diet and try to incorporate more physical activity - Semaglutide-Weight Management (WEGOVY) 1 MG/0.5ML SOAJ; Inject 1 mg into the  skin once a week.  Dispense: 2 mL; Refill: 0    Patient was given opportunity to ask questions. Patient verbalized understanding of the plan and was able to repeat key elements of the plan. All questions were answered to their satisfaction.   Jeanell Sparrow, FNP, have reviewed all documentation for this visit. The documentation on 08/06/20 for the exam, diagnosis, procedures, and orders are all accurate and complete.   THE PATIENT IS ENCOURAGED TO PRACTICE SOCIAL DISTANCING DUE TO THE COVID-19 PANDEMIC.

## 2020-07-23 NOTE — Patient Instructions (Signed)

## 2020-07-24 MED FILL — WEGOVY 1 MG/0.5ML SOAJ: 1 | 28 days supply | Qty: 2 | Fill #0

## 2020-08-16 MED FILL — AMLODIPINE BESYLATE 5 MG TA: 5 | 90 days supply | Qty: 90 | Fill #1

## 2020-08-20 ENCOUNTER — Other Ambulatory Visit (HOSPITAL_COMMUNITY): Payer: Self-pay | Admitting: Family

## 2020-08-20 MED FILL — SULFAMETHOXAZOLE-TMP DS TAB: 800-160 | 7 days supply | Qty: 28 | Fill #0

## 2020-08-25 ENCOUNTER — Encounter: Payer: Self-pay | Admitting: Nurse Practitioner

## 2020-08-26 ENCOUNTER — Other Ambulatory Visit (HOSPITAL_COMMUNITY): Payer: Self-pay | Admitting: Family

## 2020-08-26 ENCOUNTER — Other Ambulatory Visit: Payer: Self-pay

## 2020-08-26 MED ORDER — WEGOVY 1.7 MG/0.75ML ~~LOC~~ SOAJ: 1.7000 mg | SUBCUTANEOUS | 0 refills | Status: DC

## 2020-08-26 MED FILL — MUPIROCIN 2% OINTMENT: 2 | 7 days supply | Qty: 22 | Fill #0

## 2020-08-26 MED FILL — WEGOVY 1.7 MG/0.75ML SOAJ: 1.7 | 28 days supply | Qty: 3 | Fill #0

## 2020-08-26 MED FILL — DOXYCYCLINE HYCLATE 100 MG: 100 | 14 days supply | Qty: 28 | Fill #0

## 2020-08-26 MED FILL — FLUCONAZOLE 150 MG TABS: 150 | 1 days supply | Qty: 1 | Fill #0

## 2020-09-17 IMAGING — MG DIGITAL DIAGNOSTIC BILATERAL MAMMOGRAM WITH TOMO AND CAD
6 of 12 series · 6 of 36 positions shown · non-contrast
Comparison: Previous exams.

CLINICAL DATA: Possible palpable pea-sized lump in the RIGHT axilla
for several months, intermittently tender. Annual mammographic
evaluation.

EXAM:
DIGITAL DIAGNOSTIC BILATERAL MAMMOGRAM WITH CAD AND TOMO
ULTRASOUND RIGHT AXILLA

[R TAN synth-2D]
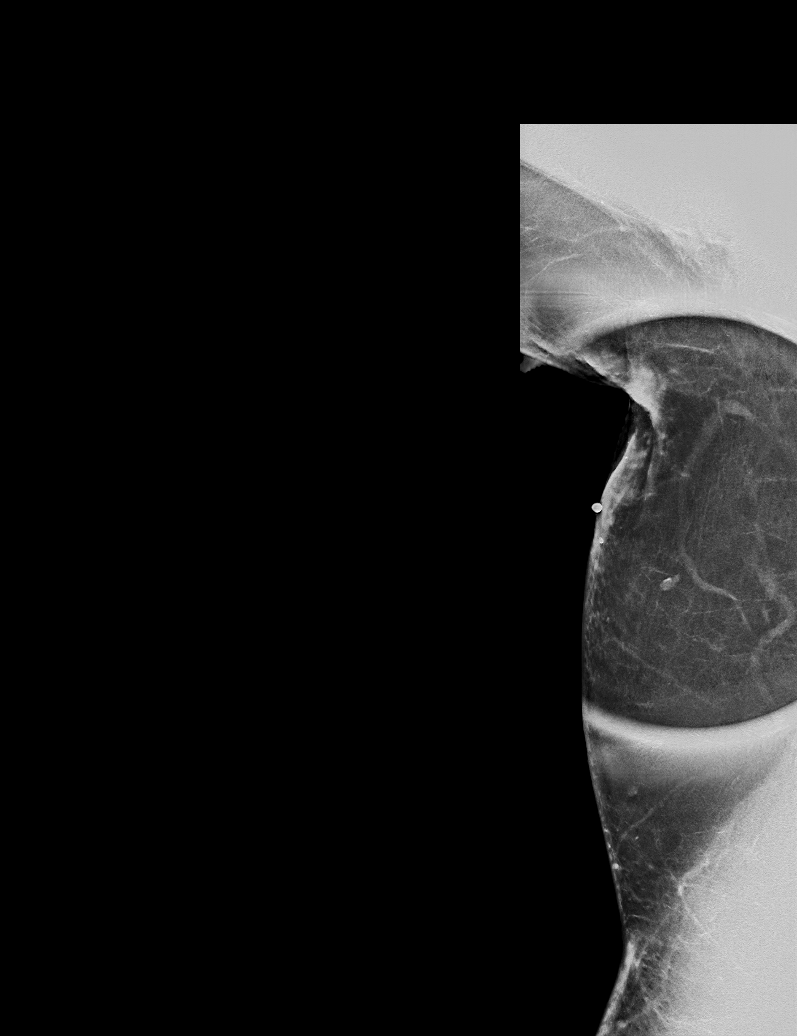

[L MLO synth-2D]
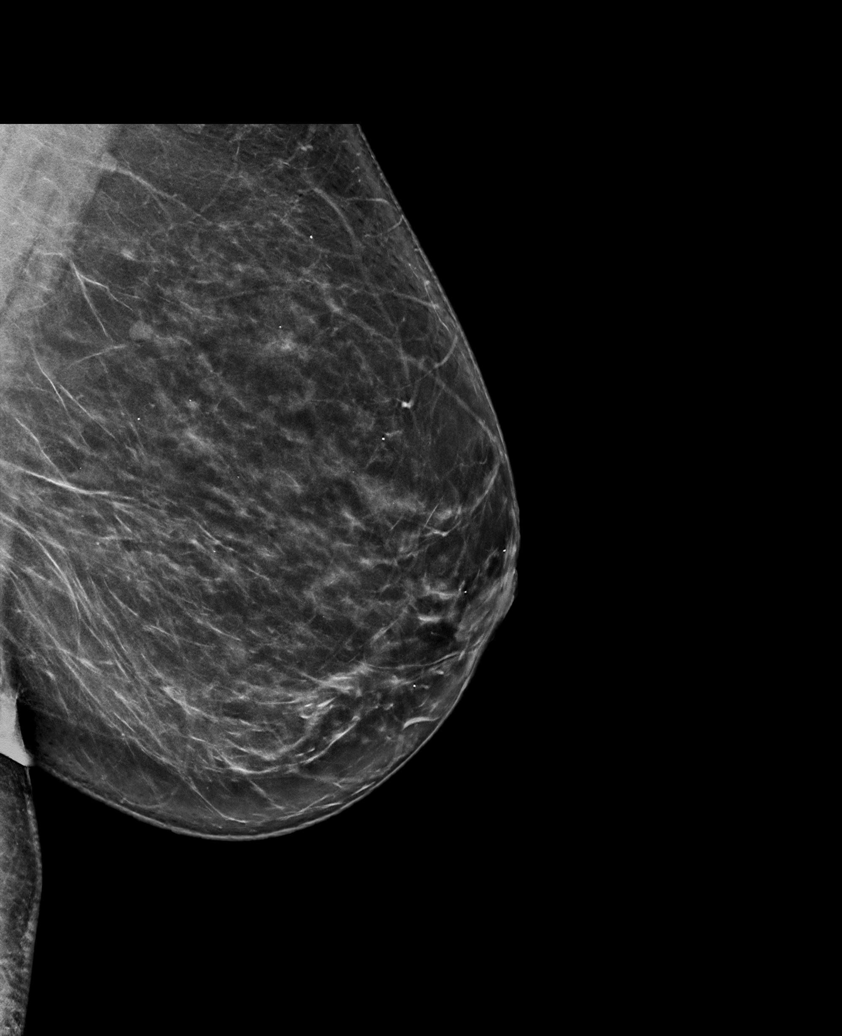

[R CC synth-2D]
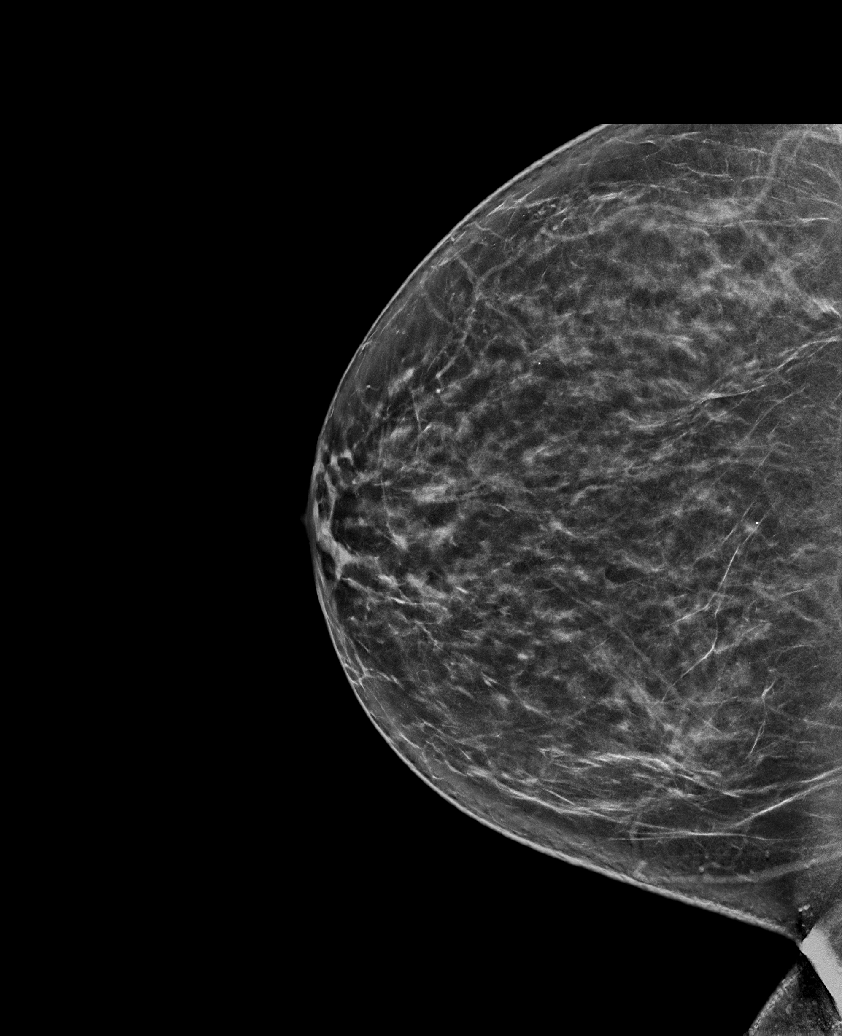

[R MLO synth-2D (1 of 2)]
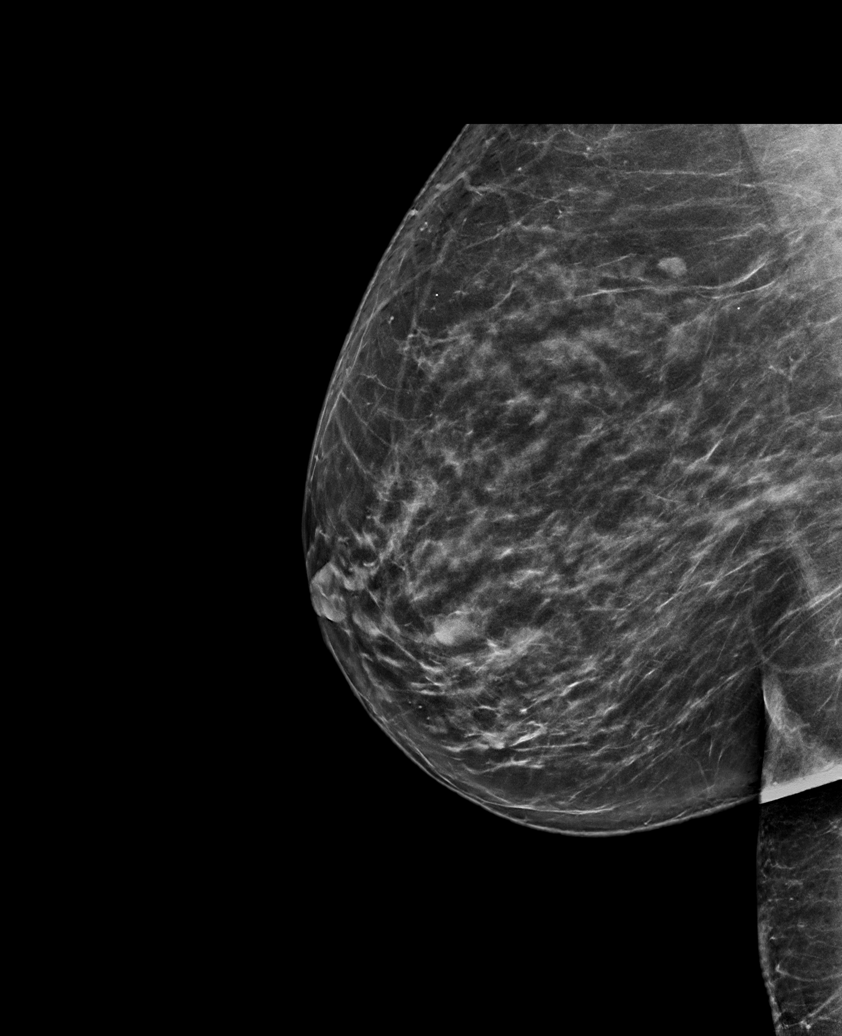

[L CC synth-2D]
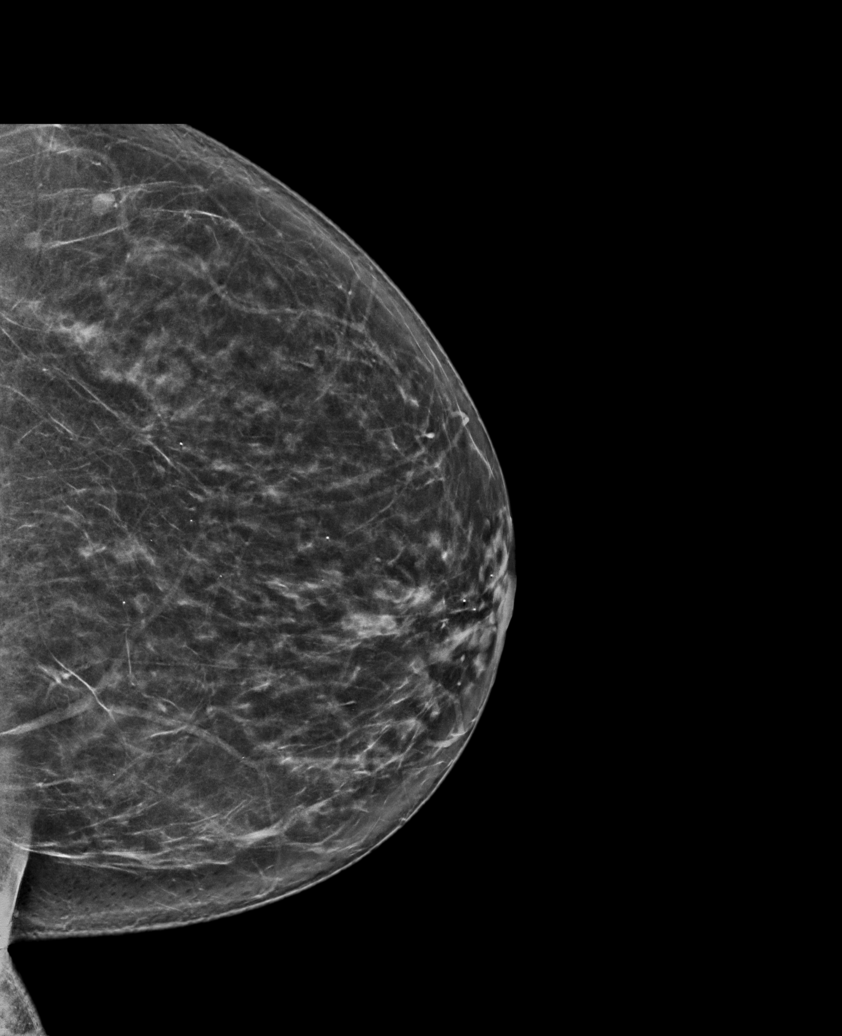

[R MLO synth-2D (2 of 2)]
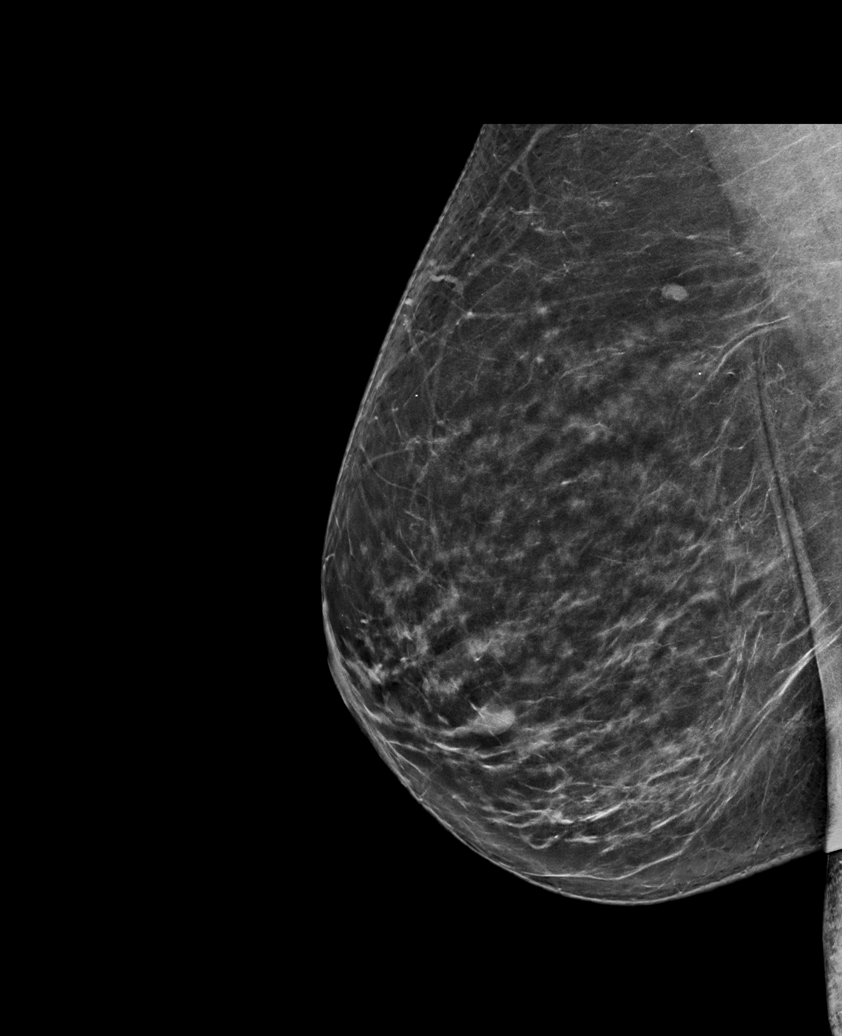

[6 of 36 positions shown; findings below may reference images not displayed]

ACR Breast Density Category c: The breast tissue is heterogeneously
dense, which may obscure small masses.
FINDINGS: Tomosynthesis and synthesized full field CC and MLO views of both
breasts were obtained. Tomosynthesis and synthesized spot
compression tangential view of the area of concern in the RIGHT
axilla was also obtained.

Mild skin thickening is identified in the area of palpable concern
in the RIGHT axilla. No underlying mass or pathologic
lymphadenopathy.

No findings suspicious for malignancy in either breast.

Mammographic images were processed with CAD.

On correlative physical exam, there is no palpable RIGHT axillary
lymphadenopathy. A palpable pea-sized superficial lump is present in
the mid axillary line.

Targeted RIGHT axillary ultrasound is performed, showing an anechoic
mass with internal echogenic debris within the skin of the RIGHT
axilla corresponding to the palpable concern. The mass measures
approximately 4 x 6 x 6 mm. A tract from the mass to the skin
surface is visible. There is no underlying pathologic
lymphadenopathy.
IMPRESSION: 1. Benign sebaceous cyst in the skin of the RIGHT axilla counting
for the palpable concern.
2. No mammographic evidence of malignancy involving either breast.

RECOMMENDATION:
Screening mammogram in one year.(Code:[id number])

I have discussed the findings and recommendations with the patient.
If applicable, a reminder letter will be sent to the patient
regarding the next appointment.

BI-RADS CATEGORY  2: Benign.

## 2020-09-23 ENCOUNTER — Other Ambulatory Visit: Payer: Self-pay | Admitting: Nurse Practitioner

## 2020-09-23 DIAGNOSIS — I10 Essential (primary) hypertension: Secondary | ICD-10-CM

## 2020-09-23 MED FILL — HYDROCHLOROTHIAZIDE 12.5 MG: 12.5 | 90 days supply | Qty: 90 | Fill #0

## 2020-09-24 ENCOUNTER — Ambulatory Visit (INDEPENDENT_AMBULATORY_CARE_PROVIDER_SITE_OTHER): Payer: 59 | Admitting: Nurse Practitioner

## 2020-09-24 ENCOUNTER — Encounter: Payer: Self-pay | Admitting: Nurse Practitioner

## 2020-09-24 ENCOUNTER — Other Ambulatory Visit: Payer: Self-pay | Admitting: Nurse Practitioner

## 2020-09-24 ENCOUNTER — Other Ambulatory Visit: Payer: Self-pay

## 2020-09-24 VITALS — BP 122/80 | HR 88 | Temp 98.2°F | Ht 60.8 in | Wt 173.6 lb

## 2020-09-24 DIAGNOSIS — Z6833 Body mass index (BMI) 33.0-33.9, adult: Secondary | ICD-10-CM | POA: Diagnosis not present

## 2020-09-24 DIAGNOSIS — E669 Obesity, unspecified: Secondary | ICD-10-CM | POA: Diagnosis not present

## 2020-09-24 DIAGNOSIS — R7303 Prediabetes: Secondary | ICD-10-CM

## 2020-09-24 DIAGNOSIS — I10 Essential (primary) hypertension: Secondary | ICD-10-CM | POA: Diagnosis not present

## 2020-09-24 LAB — BMP8+EGFR
BUN/Creatinine Ratio: 13 (ref 12–28)
BUN: 12 mg/dL (ref 8–27)
CO2: 24 mmol/L (ref 20–29)
Calcium: 9.8 mg/dL (ref 8.7–10.3)
Chloride: 98 mmol/L (ref 96–106)
Creatinine, Ser: 0.95 mg/dL (ref 0.57–1.00)
GFR calc Af Amer: 75 mL/min/{1.73_m2} (ref >59)
GFR calc non Af Amer: 65 mL/min/{1.73_m2} (ref >59)
Glucose: 84 mg/dL (ref 65–99)
Potassium: 3.6 mmol/L (ref 3.5–5.2)
Sodium: 138 mmol/L (ref 134–144)

## 2020-09-24 LAB — HEMOGLOBIN A1C
Est. average glucose Bld gHb Est-mCnc: 111 mg/dL
Hgb A1c MFr Bld: 5.5 % (ref 4.8–5.6)

## 2020-09-24 MED ORDER — WEGOVY 2.4 MG/0.75ML ~~LOC~~ SOAJ: 2.4000 mg | SUBCUTANEOUS | 1 refills | Status: DC

## 2020-09-24 MED FILL — WEGOVY 2.4 MG/0.75ML SOAJ: 2.4 | 28 days supply | Qty: 3 | Fill #0

## 2020-09-24 NOTE — Patient Instructions (Signed)

## 2020-09-24 NOTE — Progress Notes (Signed)
I,Yamilka Roman [employer] as a Education administrator for [employer], FNP.,have documented all relevant documentation on the behalf of [name], FNP,as directed by  [name], FNP while in the presence of [name], [place]. This visit occurred during the SARS-CoV-2 public health emergency.  Safety protocols were in place, including screening questions prior to the visit, additional usage of staff PPE, and extensive cleaning of exam room while observing appropriate contact time as indicated for disinfecting solutions.  Subjective:     Patient ID: Catherine Fritz , female    DOB: June 07, 1961 , 59 y.o.   MRN: [id number]   Chief Complaint  Patient presents with  . Weight Check    HPI  Here for weight check.    Wt Readings from Last 3 Encounters: 09/24/20 : 173 lb 9.6 oz (78.7 kg) 07/23/20 : 183 lb 3.2 oz (83.1 kg) 05/21/20 : 190 lb 6.4 oz (86.4 kg)  She continues to take Wegovy 1.7 mg weekly.  Sometimes she will get some nausea after the first couple days.      Past Medical History:  Diagnosis Date  . Hypertension      Family History  Problem Relation Age of Onset  . Breast cancer Paternal Aunt        in 69's  . Hypertension Mother   . Thyroid disease Mother   . Heart disease Father   . Cancer Other   . Diabetes Sister   . Thyroid disease Sister   . Diabetes Brother   . Kidney disease Brother   . Thyroid disease Brother      Current Outpatient Medications:  .  amLODipine (NORVASC) 5 MG tablet, TAKE 1 TABLET (5 MG TOTAL) BY MOUTH DAILY., Disp: 90 tablet, Rfl: 1 .  hydrochlorothiazide (HYDRODIURIL) 12.5 MG tablet, TAKE 1 TABLET (12.5 MG TOTAL) BY MOUTH DAILY., Disp: 90 tablet, Rfl: 1 .  MULTIPLE VITAMIN PO, Take 1 tablet by mouth daily., Disp: , Rfl:  .  Semaglutide-Weight Management (WEGOVY) 2.4 MG/0.75ML SOAJ, Inject 2.4 mg into the skin once a week., Disp: 3 mL, Rfl: 1   Allergies  Allergen Reactions  . Codeine Rash  . Contrave [Naltrexone-Bupropion Hcl Er] Rash      Review of Systems  Constitutional: Negative.  Negative for fatigue.  Eyes: Negative for visual disturbance.  Respiratory: Negative.  Negative for shortness of breath.   Cardiovascular: Negative.  Negative for chest pain, palpitations and leg swelling.  Gastrointestinal: Negative.   Endocrine: Negative.   Musculoskeletal: Negative.   Skin: Negative.   Neurological: Negative for dizziness, weakness and headaches.  Psychiatric/Behavioral: Negative for confusion. The patient is not nervous/anxious.      Today's Vitals   09/24/20 0938  BP: 122/80  Pulse: 88  Temp: 98.2 F (36.8 C)  Weight: 173 lb 9.6 oz (78.7 kg)  Height: 5' 0.8" (1.544 m)  PainSc: 0-No pain   Body mass index is 33.02 kg/m.   Objective:  Physical Exam Vitals reviewed.  Constitutional:      General: She is not in acute distress.    Appearance: Normal appearance. She is obese.  Cardiovascular:     Rate and Rhythm: Normal rate and regular rhythm.     Pulses: Normal pulses.     Heart sounds: Normal heart sounds. No murmur heard.   Pulmonary:     Effort: Pulmonary effort is normal. No respiratory distress.     Breath sounds: Normal breath sounds.  Skin:    Capillary Refill: Capillary refill takes less than  2 seconds.  Neurological:     General: No focal deficit present.     Mental Status: She is alert.     Cranial Nerves: No cranial nerve deficit.  Psychiatric:        Mood and Affect: Mood normal.        Behavior: Behavior normal.        Thought Content: Thought content normal.        Judgment: Judgment normal.         Assessment And Plan:     1. Class 1 obesity with body mass index (BMI) of 33.0 to 33.9 in adult, unspecified obesity type, unspecified whether serious comorbidity present  Chronic, she is doing well on Wegovy and is having steady weight loss  Continue Wegovy will increase to 2.4 mg weekly which is maximum dose   2. Essential hypertension  Chronic, well controlled  Continue  with current medications - BMP8+eGFR  3. Prediabetes  Chronic, stable  No current medications managing with diet and exercise. - Hemoglobin A1c     Patient was given opportunity to ask questions. Patient verbalized understanding of the plan and was able to repeat key elements of the plan. All questions were answered to their satisfaction.   Teola Bradley, FNP, have reviewed all documentation for this visit. The documentation on 09/24/20 for the exam, diagnosis, procedures, and orders are all accurate and complete.   THE PATIENT IS ENCOURAGED TO PRACTICE SOCIAL DISTANCING DUE TO THE COVID-19 PANDEMIC.

## 2020-10-16 ENCOUNTER — Other Ambulatory Visit: Payer: Self-pay

## 2020-10-16 ENCOUNTER — Encounter: Payer: Self-pay | Admitting: Nurse Practitioner

## 2020-10-16 ENCOUNTER — Other Ambulatory Visit: Payer: Self-pay | Admitting: Nurse Practitioner

## 2020-10-16 MED ORDER — WEGOVY 1.7 MG/0.75ML ~~LOC~~ SOAJ: 1.7000 mg | SUBCUTANEOUS | 0 refills | Status: DC

## 2020-10-16 MED ORDER — WEGOVY 2.4 MG/0.75ML ~~LOC~~ SOAJ: 2.4000 mg | SUBCUTANEOUS | 1 refills | Status: DC

## 2020-10-16 MED FILL — WEGOVY 1.7 MG/0.75ML SOAJ: 1.7 | 28 days supply | Qty: 3 | Fill #0

## 2020-10-24 ENCOUNTER — Encounter: Payer: Self-pay | Admitting: Nurse Practitioner

## 2020-10-30 ENCOUNTER — Telehealth: Payer: Self-pay

## 2020-10-30 NOTE — Telephone Encounter (Signed)
I called patient and left her a v/m letting her know her PA for wegovy 2.4mg  has been approved through 04/28/2021. YL,RMA

## 2020-11-05 MED FILL — WEGOVY 2.4 MG/0.75ML SOAJ: 2.4 | 28 days supply | Qty: 3 | Fill #0

## 2020-11-26 ENCOUNTER — Ambulatory Visit: Payer: 59 | Admitting: Nurse Practitioner

## 2020-11-27 ENCOUNTER — Other Ambulatory Visit: Payer: Self-pay | Admitting: Nurse Practitioner

## 2020-11-27 DIAGNOSIS — I10 Essential (primary) hypertension: Secondary | ICD-10-CM

## 2020-11-27 MED FILL — AMLODIPINE BESYLATE 5 MG TA: 5 | 90 days supply | Qty: 90 | Fill #0

## 2020-12-02 ENCOUNTER — Other Ambulatory Visit: Payer: Self-pay

## 2020-12-02 ENCOUNTER — Ambulatory Visit: Payer: 59 | Admitting: Nurse Practitioner

## 2020-12-02 ENCOUNTER — Encounter: Payer: Self-pay | Admitting: Nurse Practitioner

## 2020-12-02 ENCOUNTER — Other Ambulatory Visit: Payer: Self-pay | Admitting: Nurse Practitioner

## 2020-12-02 VITALS — BP 118/70 | HR 91 | Temp 98.5°F | Ht 60.8 in | Wt 165.4 lb

## 2020-12-02 DIAGNOSIS — I10 Essential (primary) hypertension: Secondary | ICD-10-CM

## 2020-12-02 DIAGNOSIS — Z6831 Body mass index (BMI) 31.0-31.9, adult: Secondary | ICD-10-CM

## 2020-12-02 MED ORDER — WEGOVY 2.4 MG/0.75ML ~~LOC~~ SOAJ: 2.4000 mg | SUBCUTANEOUS | 1 refills | Status: DC

## 2020-12-02 MED FILL — WEGOVY 2.4 MG/0.75ML SOAJ: 2.4 | 28 days supply | Qty: 3 | Fill #0

## 2020-12-02 NOTE — Progress Notes (Signed)
I,Yamilka Roman [employer] as a Neurosurgeon for [employer], FNP.,have documented all relevant documentation on the behalf of Arnette Felts, FNP,as directed by  Arnette Felts, FNP while in the presence of Arnette Felts, FNP. This visit occurred during the SARS-CoV-2 public health emergency.  Safety protocols were in place, including screening questions prior to the visit, additional usage of staff PPE, and extensive cleaning of exam room while observing appropriate contact time as indicated for disinfecting solutions.  Subjective:     Patient ID: Catherine Fritz , female    DOB: 1961/07/11 , 60 y.o.   MRN: [id number]   Chief Complaint  Patient presents with  . Weight Check  . Hypertension    HPI  Patient presents today for a weight check and a f/u on her blood pressure.  She continues to take Wegovy 2.4 mg weekly. She is tolerating well. She is not walking due to the cold. She is doing intermittent squats.    Wt Readings from Last 3 Encounters: 12/02/20 : 165 lb 6.4 oz (75 kg) 09/24/20 : 173 lb 9.6 oz (78.7 kg) 07/23/20 : 183 lb 3.2 oz (83.1 kg)  Her blood pressure at home is 92/64 or 107/70's   Hypertension This is a chronic problem. The current episode started more than 1 year ago. The problem has been gradually improving since onset. The problem is controlled. Pertinent negatives include no anxiety, blurred vision, chest pain or palpitations. There are no associated agents to hypertension. Risk factors for coronary artery disease include sedentary lifestyle. Past treatments include diuretics and calcium channel blockers. There are no compliance problems.  There is no history of angina or kidney disease. There is no history of chronic renal disease.     Past Medical History:  Diagnosis Date  . Hypertension      Family History  Problem Relation Age of Onset  . Breast cancer Paternal Aunt        in 17's  . Hypertension Mother   . Thyroid disease Mother   . Heart disease Father    . Cancer Other   . Diabetes Sister   . Thyroid disease Sister   . Diabetes Brother   . Kidney disease Brother   . Thyroid disease Brother      Current Outpatient Medications:  .  amLODipine (NORVASC) 5 MG tablet, TAKE 1 TABLET (5 MG TOTAL) BY MOUTH DAILY., Disp: 90 tablet, Rfl: 1 .  hydrochlorothiazide (HYDRODIURIL) 12.5 MG tablet, TAKE 1 TABLET (12.5 MG TOTAL) BY MOUTH DAILY., Disp: 90 tablet, Rfl: 1 .  MULTIPLE VITAMIN PO, Take 1 tablet by mouth daily., Disp: , Rfl:  .  Semaglutide-Weight Management (WEGOVY) 2.4 MG/0.75ML SOAJ, Inject 2.4 mg into the skin once a week., Disp: 3 mL, Rfl: 1   Allergies  Allergen Reactions  . Codeine Rash  . Contrave [Naltrexone-Bupropion Hcl Er] Rash     Review of Systems  Constitutional: Negative.  Negative for fatigue.  Eyes: Negative for blurred vision.  Respiratory: Negative.  Negative for cough.   Cardiovascular: Negative.  Negative for chest pain, palpitations and leg swelling.  Psychiatric/Behavioral: Negative.      Today's Vitals   12/02/20 1500  BP: 118/70  Pulse: 91  Temp: 98.5 F (36.9 C)  TempSrc: Oral  Weight: 165 lb 6.4 oz (75 kg)  Height: 5' 0.8" (1.544 m)  PainSc: 0-No pain   Body mass index is 31.46 kg/m.   Objective:  Physical Exam Vitals reviewed.  Constitutional:      General:  She is not in acute distress.    Appearance: Normal appearance. She is well-developed. She is obese.  Eyes:     Pupils: Pupils are equal, round, and reactive to light.  Cardiovascular:     Rate and Rhythm: Normal rate and regular rhythm.     Pulses: Normal pulses.     Heart sounds: Normal heart sounds. No murmur heard.   Pulmonary:     Effort: Pulmonary effort is normal. No respiratory distress.     Breath sounds: Normal breath sounds. No wheezing.  Musculoskeletal:        General: Normal range of motion.  Skin:    General: Skin is warm and dry.     Capillary Refill: Capillary refill takes less than 2 seconds.  Neurological:      General: No focal deficit present.     Mental Status: She is alert and oriented to person, place, and time.     Cranial Nerves: No cranial nerve deficit.  Psychiatric:        Mood and Affect: Mood normal.        Behavior: Behavior normal.        Thought Content: Thought content normal.        Judgment: Judgment normal.         Assessment And Plan:     1. Essential hypertension  Chronic, well controlled  Continue with current medications  2. BMI 31.0-31.9,adult Wt Readings from Last 3 Encounters:  12/02/20 165 lb 6.4 oz (75 kg)  09/24/20 173 lb 9.6 oz (78.7 kg)  07/23/20 183 lb 3.2 oz (83.1 kg)    She is doing well with her weight loss on Wegovy, she is encouraged to increase her physical activity to at least 150 minutes per week - Semaglutide-Weight Management (WEGOVY) 2.4 MG/0.75ML SOAJ; Inject 2.4 mg into the skin once a week.  Dispense: 3 mL; Refill: 1     Patient was given opportunity to ask questions. Patient verbalized understanding of the plan and was able to repeat key elements of the plan. All questions were answered to their satisfaction.  Arnette Felts, FNP   I, Arnette Felts, FNP, have reviewed all documentation for this visit. The documentation on 12/02/20 for the exam, diagnosis, procedures, and orders are all accurate and complete.   THE PATIENT IS ENCOURAGED TO PRACTICE SOCIAL DISTANCING DUE TO THE COVID-19 PANDEMIC.

## 2020-12-02 NOTE — Patient Instructions (Signed)

## 2020-12-17 DIAGNOSIS — H5203 Hypermetropia, bilateral: Secondary | ICD-10-CM | POA: Diagnosis not present

## 2020-12-29 ENCOUNTER — Encounter: Payer: Self-pay | Admitting: Nurse Practitioner

## 2020-12-30 ENCOUNTER — Other Ambulatory Visit: Payer: Self-pay

## 2020-12-30 ENCOUNTER — Other Ambulatory Visit: Payer: Self-pay | Admitting: Internal Medicine

## 2020-12-30 DIAGNOSIS — Z6831 Body mass index (BMI) 31.0-31.9, adult: Secondary | ICD-10-CM

## 2020-12-30 MED ORDER — WEGOVY 2.4 MG/0.75ML ~~LOC~~ SOAJ: 2.4000 mg | SUBCUTANEOUS | 1 refills | Status: DC

## 2020-12-30 MED FILL — WEGOVY 2.4 MG/0.75ML SOAJ: 2.4 | 28 days supply | Qty: 3 | Fill #0

## 2021-01-29 MED FILL — WEGOVY 2.4 MG/0.75ML SOAJ: 2.4 | 28 days supply | Qty: 3 | Fill #1

## 2021-02-03 ENCOUNTER — Other Ambulatory Visit: Payer: Self-pay

## 2021-02-03 ENCOUNTER — Other Ambulatory Visit: Payer: Self-pay | Admitting: Nurse Practitioner

## 2021-02-03 ENCOUNTER — Ambulatory Visit: Payer: 59 | Admitting: Nurse Practitioner

## 2021-02-03 ENCOUNTER — Encounter: Payer: Self-pay | Admitting: Nurse Practitioner

## 2021-02-03 VITALS — BP 152/94 | HR 74 | Temp 98.0°F | Ht 60.8 in | Wt 164.0 lb

## 2021-02-03 DIAGNOSIS — I10 Essential (primary) hypertension: Secondary | ICD-10-CM

## 2021-02-03 DIAGNOSIS — E6609 Other obesity due to excess calories: Secondary | ICD-10-CM | POA: Diagnosis not present

## 2021-02-03 DIAGNOSIS — Z6831 Body mass index (BMI) 31.0-31.9, adult: Secondary | ICD-10-CM

## 2021-02-03 MED ORDER — WEGOVY 2.4 MG/0.75ML ~~LOC~~ SOAJ: 2.4000 mg | SUBCUTANEOUS | 1 refills | Status: DC

## 2021-02-03 NOTE — Patient Instructions (Signed)

## 2021-02-03 NOTE — Progress Notes (Signed)
Tomasa Hose as a scribe for Arnette Felts, FNP.,have documented all relevant documentation on the behalf of Arnette Felts, FNP,as directed by  Arnette Felts, FNP while in the presence of Arnette Felts, FNP.  This visit occurred during the SARS-CoV-2 public health emergency.  Safety protocols were in place, including screening questions prior to the visit, additional usage of staff PPE, and extensive cleaning of exam room while observing appropriate contact time as indicated for disinfecting solutions.  Subjective:     Patient ID: Catherine Fritz , female    DOB: May 25, 1961 , 60 y.o.   MRN: [id number]   Chief Complaint  Patient presents with  . Obesity    HPI  Here for weight check.     Wt Readings from Last 3 Encounters: 02/03/21 : 164 lb (74.4 kg) 12/02/20 : 165 lb 6.4 oz (75 kg) 09/24/20 : 173 lb 9.6 oz (78.7 kg)  She is not walking as much due to the weather being cold. She is doing strength training 2 days a week. She is not eating as much since taking the [facility]. She is not having any nausea.      Past Medical History:  Diagnosis Date  . Hypertension      Family History  Problem Relation Age of Onset  . Breast cancer Paternal Aunt        in 69's  . Hypertension Mother   . Thyroid disease Mother   . Heart disease Father   . Cancer Other   . Diabetes Sister   . Thyroid disease Sister   . Diabetes Brother   . Kidney disease Brother   . Thyroid disease Brother      Current Outpatient Medications:  .  amLODipine (NORVASC) 5 MG tablet, TAKE 1 TABLET (5 MG TOTAL) BY MOUTH DAILY., Disp: 90 tablet, Rfl: 1 .  doxycycline (VIBRAMYCIN) 100 MG capsule, TAKE 1 CAPSULE BY MOUTH TWICE DAILY FOR 14 DAYS., Disp: 28 capsule, Rfl: 0 .  fluconazole (DIFLUCAN) 150 MG tablet, TAKE 1 TABLET BY MOUTH ONCE AS A SINGLE DOSE., Disp: 1 tablet, Rfl: 0 .  MULTIPLE VITAMIN PO, Take 1 tablet by mouth daily., Disp: , Rfl:  .  mupirocin ointment (BACTROBAN) 2 %, APPLY TOPICALLY TO  AFFECTED AREA THREE TIMES DAILY FOR 7 DAYS., Disp: 22 g, Rfl: 0 .  Semaglutide-Weight Management 2.4 MG/0.75ML SOAJ, INJECT 2.4 MG INTO THE SKIN ONCE A WEEK., Disp: 3 mL, Rfl: 1 .  sulfamethoxazole-trimethoprim (BACTRIM DS) 800-160 MG tablet, TAKE 2 TABLETS BY MOUTH 2 TIMES A DAY, Disp: 28 tablet, Rfl: 0   Allergies  Allergen Reactions  . Codeine Rash  . Contrave [Naltrexone-Bupropion Hcl Er] Rash     Review of Systems  Constitutional: Negative.  Negative for fatigue.  HENT: Negative.   Respiratory: Negative.   Cardiovascular: Negative.   Gastrointestinal: Negative.   Endocrine: Negative for polydipsia, polyphagia and polyuria.  Musculoskeletal: Negative.   Skin: Negative.   Neurological: Negative for dizziness and headaches.  Psychiatric/Behavioral: Negative.      Today's Vitals   02/03/21 1503 02/03/21 1534  BP: (!) 158/92 (!) 152/94  Pulse: 74   Temp: 98 F (36.7 C)   TempSrc: Oral   Weight: 164 lb (74.4 kg)   Height: 5' 0.8" (1.544 m)    Body mass index is 31.19 kg/m.  Wt Readings from Last 3 Encounters:  02/03/21 164 lb (74.4 kg)  12/02/20 165 lb 6.4 oz (75 kg)  09/24/20 173 lb 9.6 oz (78.7 kg)  Objective:  Physical Exam Vitals reviewed.  Constitutional:      General: She is not in acute distress.    Appearance: Normal appearance. She is obese.  HENT:     Head: Normocephalic.     Right Ear: There is no impacted cerumen.  Eyes:     Extraocular Movements: Extraocular movements intact.     Pupils: Pupils are equal, round, and reactive to light.  Cardiovascular:     Rate and Rhythm: Normal rate and regular rhythm.     Pulses: Normal pulses.     Heart sounds: Normal heart sounds. No murmur heard.   Pulmonary:     Effort: Pulmonary effort is normal. No respiratory distress.     Breath sounds: Normal breath sounds. No wheezing.  Skin:    General: Skin is warm and dry.     Capillary Refill: Capillary refill takes less than 2 seconds.     Findings: Rash  (discoloration to bilateral cheeks) present.  Neurological:     General: No focal deficit present.     Mental Status: She is alert and oriented to person, place, and time.     Cranial Nerves: No cranial nerve deficit.     Motor: No weakness.  Psychiatric:        Mood and Affect: Mood normal.        Behavior: Behavior normal.        Thought Content: Thought content normal.        Judgment: Judgment normal.         Assessment And Plan:     1. Class 1 obesity due to excess calories with serious comorbidity and body mass index (BMI) of 31.0 to 31.9 in adult  Weight is stable currently, continue with Wegovy 2.4 mg weekly  Continue to increase physical activity of at least 150 minutes per week Wt Readings from Last 3 Encounters:  02/03/21 164 lb (74.4 kg)  12/02/20 165 lb 6.4 oz (75 kg)  09/24/20 173 lb 9.6 oz (78.7 kg)    2. BMI 31.0-31.9,adult  3. Essential hypertension  Chronic, blood pressure is elevated today, she was rushing to get to the office  I will not make any changes this visit  She is encouraged to recheck her blood pressure at home this evening once she is settled      Patient was given opportunity to ask questions. Patient verbalized understanding of the plan and was able to repeat key elements of the plan. All questions were answered to their satisfaction.  Arnette Felts, FNP    I, Arnette Felts, FNP, have reviewed all documentation for this visit. The documentation on 02/03/21 for the exam, diagnosis, procedures, and orders are all accurate and complete.  IF YOU HAVE BEEN REFERRED TO A SPECIALIST, IT MAY TAKE 1-2 WEEKS TO SCHEDULE/PROCESS THE REFERRAL. IF YOU HAVE NOT HEARD FROM US/SPECIALIST IN TWO WEEKS, PLEASE GIVE [place] A CALL AT [phone] X [phone].   THE PATIENT IS ENCOURAGED TO PRACTICE SOCIAL DISTANCING DUE TO THE COVID-19 PANDEMIC.

## 2021-02-24 ENCOUNTER — Other Ambulatory Visit: Payer: 59

## 2021-02-26 ENCOUNTER — Other Ambulatory Visit (HOSPITAL_COMMUNITY): Payer: Self-pay

## 2021-02-26 MED FILL — Semaglutide (Weight Mngmt) Soln Auto-Injector 2.4 MG/0.75ML: SUBCUTANEOUS | 28 days supply | Qty: 3 | Fill #0 | Status: AC

## 2021-03-06 ENCOUNTER — Other Ambulatory Visit (HOSPITAL_COMMUNITY): Payer: Self-pay

## 2021-03-06 MED ORDER — CARESTART COVID-19 HOME TEST VI KIT
PACK | 0 refills | Status: DC
  Filled 2021-03-06: qty 2, 2d supply, fill #0

## 2021-03-06 MED FILL — Amlodipine Besylate Tab 5 MG (Base Equivalent): ORAL | 90 days supply | Qty: 90 | Fill #0 | Status: AC

## 2021-03-25 ENCOUNTER — Other Ambulatory Visit (HOSPITAL_COMMUNITY): Payer: Self-pay

## 2021-03-25 ENCOUNTER — Encounter: Payer: Self-pay | Admitting: Nurse Practitioner

## 2021-03-25 ENCOUNTER — Other Ambulatory Visit (HOSPITAL_COMMUNITY)
Admission: RE | Admit: 2021-03-25 | Discharge: 2021-03-25 | Disposition: A | Discharge status: Home / Self Care | Payer: 59 | Source: Ambulatory Visit | Attending: Nurse Practitioner | Admitting: Nurse Practitioner

## 2021-03-25 ENCOUNTER — Ambulatory Visit (INDEPENDENT_AMBULATORY_CARE_PROVIDER_SITE_OTHER): Payer: 59 | Admitting: Nurse Practitioner

## 2021-03-25 ENCOUNTER — Other Ambulatory Visit: Payer: Self-pay

## 2021-03-25 VITALS — BP 130/78 | HR 84 | Temp 98.5°F | Ht 60.0 in | Wt 162.4 lb

## 2021-03-25 DIAGNOSIS — E6609 Other obesity due to excess calories: Secondary | ICD-10-CM | POA: Diagnosis not present

## 2021-03-25 DIAGNOSIS — E782 Mixed hyperlipidemia: Secondary | ICD-10-CM | POA: Diagnosis not present

## 2021-03-25 DIAGNOSIS — Z0000 Encounter for general adult medical examination without abnormal findings: Secondary | ICD-10-CM

## 2021-03-25 DIAGNOSIS — I10 Essential (primary) hypertension: Secondary | ICD-10-CM

## 2021-03-25 DIAGNOSIS — Z6831 Body mass index (BMI) 31.0-31.9, adult: Secondary | ICD-10-CM

## 2021-03-25 DIAGNOSIS — Z8616 Personal history of COVID-19: Secondary | ICD-10-CM

## 2021-03-25 DIAGNOSIS — Z1231 Encounter for screening mammogram for malignant neoplasm of breast: Secondary | ICD-10-CM | POA: Diagnosis not present

## 2021-03-25 DIAGNOSIS — R7303 Prediabetes: Secondary | ICD-10-CM

## 2021-03-25 DIAGNOSIS — Z124 Encounter for screening for malignant neoplasm of cervix: Secondary | ICD-10-CM | POA: Insufficient documentation

## 2021-03-25 LAB — POCT URINALYSIS DIPSTICK
Bilirubin, UA: NEGATIVE
Blood, UA: NEGATIVE
Glucose, UA: NEGATIVE
Ketones, UA: NEGATIVE
Nitrite, UA: NEGATIVE
Protein, UA: NEGATIVE
Spec Grav, UA: 1.02 (ref 1.010–1.025)
Urobilinogen, UA: 0.2 E.U./dL
pH, UA: 7.5 (ref 5.0–8.0)

## 2021-03-25 LAB — POCT UA - MICROALBUMIN
Albumin/Creatinine Ratio, Urine, POC: <30
Creatinine, POC: 100 mg/dL
Microalbumin Ur, POC: 30 mg/L

## 2021-03-25 LAB — LIPID PANEL
Chol/HDL Ratio: 2.7 ratio (ref 0.0–4.4)
Cholesterol, Total: 214 mg/dL — ABNORMAL HIGH (ref 100–199)
HDL: 80 mg/dL (ref >39)
LDL Chol Calc (NIH): 123 mg/dL — ABNORMAL HIGH (ref 0–99)
Triglycerides: 62 mg/dL (ref 0–149)
VLDL Cholesterol Cal: 11 mg/dL (ref 5–40)

## 2021-03-25 MED ORDER — SEMAGLUTIDE-WEIGHT MANAGEMENT 2.4 MG/0.75ML ~~LOC~~ SOAJ
2.4000 mg | SUBCUTANEOUS | 1 refills | Status: DC
  Filled 2021-03-25: qty 3, 28d supply, fill #0
  Filled 2021-04-25 – 2021-04-30 (×3): qty 3, 28d supply, fill #1

## 2021-03-25 NOTE — Patient Instructions (Signed)
Health Maintenance, Female Adopting a healthy lifestyle and getting preventive care are important in promoting health and wellness. Ask your health care provider about:  The right schedule for you to have regular tests and exams.  Things you can do on your own to prevent diseases and keep yourself healthy. What should I know about diet, weight, and exercise? Eat a healthy diet  Eat a diet that includes plenty of vegetables, fruits, low-fat dairy products, and lean protein.  Do not eat a lot of foods that are high in solid fats, added sugars, or sodium.   Maintain a healthy weight Body mass index (BMI) is used to identify weight problems. It estimates body fat based on height and weight. Your health care provider can help determine your BMI and help you achieve or maintain a healthy weight. Get regular exercise Get regular exercise. This is one of the most important things you can do for your health. Most adults should:  Exercise for at least 150 minutes each week. The exercise should increase your heart rate and make you sweat (moderate-intensity exercise).  Do strengthening exercises at least twice a week. This is in addition to the moderate-intensity exercise.  Spend less time sitting. Even light physical activity can be beneficial. Watch cholesterol and blood lipids Have your blood tested for lipids and cholesterol at 62 years of age, then have this test every 5 years. Have your cholesterol levels checked more often if:  Your lipid or cholesterol levels are high.  You are older than 62 years of age.  You are at high risk for heart disease. What should I know about cancer screening? Depending on your health history and family history, you may need to have cancer screening at various ages. This may include screening for:  Breast cancer.  Cervical cancer.  Colorectal cancer.  Skin cancer.  Lung cancer. What should I know about heart disease, diabetes, and high blood  pressure? Blood pressure and heart disease  High blood pressure causes heart disease and increases the risk of stroke. This is more likely to develop in people who have high blood pressure readings, are of African descent, or are overweight.  Have your blood pressure checked: ? Every 3-5 years if you are 18-39 years of age. ? Every year if you are 40 years old or older. Diabetes Have regular diabetes screenings. This checks your fasting blood sugar level. Have the screening done:  Once every three years after age 40 if you are at a normal weight and have a low risk for diabetes.  More often and at a younger age if you are overweight or have a high risk for diabetes. What should I know about preventing infection? Hepatitis B If you have a higher risk for hepatitis B, you should be screened for this virus. Talk with your health care provider to find out if you are at risk for hepatitis B infection. Hepatitis C Testing is recommended for:  Everyone born from 1945 through 1965.  Anyone with known risk factors for hepatitis C. Sexually transmitted infections (STIs)  Get screened for STIs, including gonorrhea and chlamydia, if: ? You are sexually active and are younger than 62 years of age. ? You are older than 62 years of age and your health care provider tells you that you are at risk for this type of infection. ? Your sexual activity has changed since you were last screened, and you are at increased risk for chlamydia or gonorrhea. Ask your health care provider   if you are at risk.  Ask your health care provider about whether you are at high risk for HIV. Your health care provider may recommend a prescription medicine to help prevent HIV infection. If you choose to take medicine to prevent HIV, you should first get tested for HIV. You should then be tested every 3 months for as long as you are taking the medicine. Pregnancy  If you are about to stop having your period (premenopausal) and  you may become pregnant, seek counseling before you get pregnant.  Take 400 to 800 micrograms (mcg) of folic acid every day if you become pregnant.  Ask for birth control (contraception) if you want to prevent pregnancy. Osteoporosis and menopause Osteoporosis is a disease in which the bones lose minerals and strength with aging. This can result in bone fractures. If you are 65 years old or older, or if you are at risk for osteoporosis and fractures, ask your health care provider if you should:  Be screened for bone loss.  Take a calcium or vitamin D supplement to lower your risk of fractures.  Be given hormone replacement therapy (HRT) to treat symptoms of menopause. Follow these instructions at home: Lifestyle  Do not use any products that contain nicotine or tobacco, such as cigarettes, e-cigarettes, and chewing tobacco. If you need help quitting, ask your health care provider.  Do not use street drugs.  Do not share needles.  Ask your health care provider for help if you need support or information about quitting drugs. Alcohol use  Do not drink alcohol if: ? Your health care provider tells you not to drink. ? You are pregnant, may be pregnant, or are planning to become pregnant.  If you drink alcohol: ? Limit how much you use to 0-1 drink a day. ? Limit intake if you are breastfeeding.  Be aware of how much alcohol is in your drink. In the [place] one drink equals one 12 oz bottle of beer (355 mL), one 5 oz glass of wine (148 mL), or one 1 oz glass of hard liquor (44 mL). General instructions  Schedule regular health, dental, and eye exams.  Stay current with your vaccines.  Tell your health care provider if: ? You often feel depressed. ? You have ever been abused or do not feel safe at home. Summary  Adopting a healthy lifestyle and getting preventive care are important in promoting health and wellness.  Follow your health care provider's instructions about healthy  diet, exercising, and getting tested or screened for diseases.  Follow your health care provider's instructions on monitoring your cholesterol and blood pressure. This information is not intended to replace advice given to you by your health care provider. Make sure you discuss any questions you have with your health care provider. Document Revised: 10/19/2018 Document Reviewed: 10/19/2018 Elsevier Patient Education  2021 Elsevier Inc.  

## 2021-03-25 NOTE — Progress Notes (Signed)
I,Catherine Fritz,acting as a Education administrator for Catherine Brine, FNP.,have documented all relevant documentation on the behalf of Catherine Brine, FNP,as directed by  Catherine Brine, FNP while in the presence of Catherine Fritz, [place].  This visit occurred during the SARS-CoV-2 public health emergency.  Safety protocols were in place, including screening questions prior to the visit, additional usage of staff PPE, and extensive cleaning of exam room while observing appropriate contact time as indicated for disinfecting solutions.  Subjective:     Patient ID: Catherine Fritz , female    DOB: [birth year] , 60 y.o.   MRN: [id number]   Chief Complaint  Patient presents with  . Annual Exam    HPI  Pt is here for HM. She had covid at the beginning of April only had fever and cough. She has had 2 covid vaccines and 1 booster   Wt Readings from Last 3 Encounters: 03/25/21 : 162 lb 6.4 oz (73.7 kg) 02/03/21 : 164 lb (74.4 kg) 12/02/20 : 165 lb 6.4 oz (75 kg)   Hypertension This is a chronic problem. The current episode started more than 1 year ago. The problem has been gradually worsening since onset. The problem is controlled. Pertinent negatives include no anxiety, chest pain, headaches, palpitations or shortness of breath. Risk factors for coronary artery disease include sedentary lifestyle and obesity. Past treatments include calcium channel blockers and diuretics. The current treatment provides significant improvement. Compliance problems include exercise.  There is no history of kidney disease. There is no history of chronic renal disease.     Past Medical History:  Diagnosis Date  . Hypertension      Family History  Problem Relation Age of Onset  . Breast cancer Paternal Aunt        in 70's  . Hypertension Mother   . Thyroid disease Mother   . Heart disease Father   . Cancer Other   . Diabetes Sister   . Thyroid disease Sister   . Diabetes Brother   . Kidney disease Brother   . Thyroid disease  Brother      Current Outpatient Medications:  .  amLODipine (NORVASC) 5 MG tablet, TAKE 1 TABLET (5 MG TOTAL) BY MOUTH DAILY., Disp: 90 tablet, Rfl: 1 .  COVID-19 At Home Antigen Test [facility] COVID-19 HOME TEST) KIT, Use as directed within package instructions. (Patient taking differently: Use as directed within package instructions.), Disp: 2 each, Rfl: 0 .  MULTIPLE VITAMIN PO, Take 1 tablet by mouth daily., Disp: , Rfl:  .  fluconazole (DIFLUCAN) 150 MG tablet, TAKE 1 TABLET BY MOUTH ONCE AS A SINGLE DOSE. (Patient not taking: Reported on 03/25/2021), Disp: 1 tablet, Rfl: 0 .  mupirocin ointment (BACTROBAN) 2 %, APPLY TOPICALLY TO AFFECTED AREA THREE TIMES DAILY FOR 7 DAYS. (Patient not taking: Reported on 03/25/2021), Disp: 22 g, Rfl: 0 .  Semaglutide-Weight Management 2.4 MG/0.75ML SOAJ, Inject 2.4 mg into the skin once a week., Disp: 3 mL, Rfl: 1   Allergies  Allergen Reactions  . Codeine Rash  . Contrave [Naltrexone-Bupropion Hcl Er] Rash      The patient states she uses status post hysterectomy for birth control.  Patient's last menstrual period was 09/07/2012.. Negative for Dysmenorrhea and Negative for Menorrhagia. Negative for: breast discharge, breast lump(s), breast pain and breast self exam. Associated symptoms include abnormal vaginal bleeding. Pertinent negatives include abnormal bleeding (hematology), anxiety, decreased libido, depression, difficulty falling sleep, dyspareunia, history of infertility, nocturia, sexual dysfunction, sleep disturbances, urinary incontinence, urinary  urgency, vaginal discharge and vaginal itching. Diet regular; she is not eating as much and realizes she is not eating as many fruits and vegetables that she should. The patient states her exercise level is moderate, she is starting back walking this week. She is drinking 4-6 glasses of water a day  The patient's tobacco use is:  Social History   Tobacco Use  Smoking Status Never Smoker  Smokeless  Tobacco Never Used   She has been exposed to passive smoke. The patient's alcohol use is:  Social History   Substance and Sexual Activity  Alcohol Use No   Additional information: Last pap 02/14/2018, next one scheduled for today.    Review of Systems  Constitutional: Negative.   HENT: Negative.   Eyes: Negative.   Respiratory: Positive for cough (occasional cough at night since having Covid in April). Negative for shortness of breath.   Cardiovascular: Negative.  Negative for chest pain, palpitations and leg swelling.  Gastrointestinal: Negative.   Endocrine: Negative.   Genitourinary: Negative.   Musculoskeletal: Negative.   Skin: Negative.   Allergic/Immunologic: Negative.   Neurological: Negative.  Negative for headaches.  Hematological: Negative.   Psychiatric/Behavioral: Negative.      Today's Vitals   03/25/21 0858  BP: 130/78  Pulse: 84  Temp: 98.5 F (36.9 C)  SpO2: 99%  Weight: 162 lb 6.4 oz (73.7 kg)  Height: 5' (1.524 m)  PainSc: 0-No pain   Body mass index is 31.72 kg/m.   Objective:  Physical Exam Constitutional:      General: She is not in acute distress.    Appearance: Normal appearance. She is well-developed. She is obese.  HENT:     Head: Normocephalic and atraumatic.     Right Ear: Hearing, tympanic membrane, ear canal and external ear normal. There is no impacted cerumen.     Left Ear: Hearing, tympanic membrane, ear canal and external ear normal. There is no impacted cerumen.     Nose:     Comments: Deferred - masked    Mouth/Throat:     Comments: Deferred - masked Eyes:     General: Lids are normal.     Extraocular Movements: Extraocular movements intact.     Conjunctiva/sclera: Conjunctivae normal.     Pupils: Pupils are equal, round, and reactive to light.     Funduscopic exam:    Right eye: No papilledema.        Left eye: No papilledema.  Neck:     Thyroid: No thyroid mass.     Vascular: No carotid bruit.  Cardiovascular:      Rate and Rhythm: Normal rate and regular rhythm.     Pulses: Normal pulses.     Heart sounds: Normal heart sounds. No murmur heard.   Pulmonary:     Effort: Pulmonary effort is normal. No respiratory distress.     Breath sounds: Normal breath sounds. No wheezing.  Chest:     Chest wall: No mass.  Breasts:     Tanner Score is 5.     Right: Normal. No mass, tenderness, axillary adenopathy or supraclavicular adenopathy.     Left: Normal. No mass, tenderness, axillary adenopathy or supraclavicular adenopathy.    Abdominal:     General: Abdomen is flat. Bowel sounds are normal. There is no distension.     Palpations: Abdomen is soft.     Tenderness: There is no abdominal tenderness.  Genitourinary:    Vagina: Normal.     Cervix: Normal.  Uterus: Normal.      Adnexa: Right adnexa normal and left adnexa normal.       Right: No mass or tenderness.       Rectum: Normal.  Musculoskeletal:        General: No swelling or tenderness. Normal range of motion.     Cervical back: Full passive range of motion without pain, normal range of motion and neck supple.     Right lower leg: No edema.     Left lower leg: No edema.  Lymphadenopathy:     Upper Body:     Right upper body: No supraclavicular, axillary or pectoral adenopathy.     Left upper body: No supraclavicular, axillary or pectoral adenopathy.  Skin:    General: Skin is warm and dry.     Capillary Refill: Capillary refill takes less than 2 seconds.  Neurological:     General: No focal deficit present.     Mental Status: She is alert and oriented to person, place, and time.     Cranial Nerves: No cranial nerve deficit.     Sensory: No sensory deficit.     Motor: No weakness.  Psychiatric:        Mood and Affect: Mood normal.        Behavior: Behavior normal.        Thought Content: Thought content normal.        Judgment: Judgment normal.         Assessment And Plan:     1. Health maintenance examination . Behavior  modifications discussed and diet history reviewed.   . Pt will continue to exercise regularly and modify diet with low GI, plant based foods and decrease intake of processed foods.  . Recommend intake of daily multivitamin, Vitamin D, and calcium.  . Recommend mammogram and colonoscopy for preventive screenings, as well as recommend immunizations that include influenza, TDAP, and Shingles  2. Encounter for screening mammogram for malignant neoplasm of breast  Pt instructed on Self Breast Exam.According to ACOG guidelines Women aged 75 and older are recommended to get an annual mammogram. Form completed and given to patient contact the [facility] for appointment scheduing.   Pt encouraged to get annual mammogram - MM DIGITAL SCREENING BILATERAL; Future  3. Encounter for Papanicolaou smear of cervix  PAP done no abnormal findings on physical exam - Cytology -Pap Smear  4. Essential hypertension B/P is controlled.  CMP ordered to check renal function.  The importance of regular exercise and dietary modification was stressed to the patient.  Stressed importance of losing ten percent of her body weight to help with B/P control.  The weight loss would help with decreasing cardiac and cancer risk as well.  EKG done with NSR HR 66 - POCT UA - Microalbumin - POCT urinalysis dipstick - EKG 12-Lead - CMP14+EGFR  5. Class 1 obesity due to excess calories with serious comorbidity and body mass index (BMI) of 31.0 to 31.9 in adult  Chronic, stable  Discussed healthy diet and regular exercise options   Encouraged to exercise at least 150 minutes per week with 2 days of strength training  Continue with wegovy, tolerating well  Return in 2 months for weight check. - Semaglutide-Weight Management 2.4 MG/0.75ML SOAJ; Inject 2.4 mg into the skin once a week.  Dispense: 3 mL; Refill: 1  6. Mixed hyperlipidemia  Chronic, controlled  No current medications - Lipid panel  7.  Prediabetes  Chronic, controlled  Continue with  current medications  Encouraged to limit intake of sugary foods and drinks  No current medications - CBC - Hemoglobin A1c  8. History of COVID-19 Overall is doing well    Patient was given opportunity to ask questions. Patient verbalized understanding of the plan and was able to repeat key elements of the plan. All questions were answered to their satisfaction.   Catherine Brine, FNP   I, Catherine Brine, FNP, have reviewed all documentation for this visit. The documentation on 03/25/21 for the exam, diagnosis, procedures, and orders are all accurate and complete.   THE PATIENT IS ENCOURAGED TO PRACTICE SOCIAL DISTANCING DUE TO THE COVID-19 PANDEMIC.

## 2021-03-26 LAB — CBC
Hematocrit: 41.2 % (ref 34.0–46.6)
Hemoglobin: 14 g/dL (ref 11.1–15.9)
MCH: 27.1 pg (ref 26.6–33.0)
MCHC: 34 g/dL (ref 31.5–35.7)
MCV: 80 fL (ref 79–97)
Platelets: 268 10*3/uL (ref 150–450)
RBC: 5.17 x10E6/uL (ref 3.77–5.28)
RDW: 13.5 % (ref 11.7–15.4)
WBC: 4.1 10*3/uL (ref 3.4–10.8)

## 2021-03-26 LAB — CMP14+EGFR
ALT: 19 IU/L (ref 0–32)
AST: 18 IU/L (ref 0–40)
Albumin/Globulin Ratio: 1.4 (ref 1.2–2.2)
Albumin: 4.2 g/dL (ref 3.8–4.8)
Alkaline Phosphatase: 100 IU/L (ref 44–121)
BUN/Creatinine Ratio: 13 (ref 12–28)
BUN: 12 mg/dL (ref 8–27)
Bilirubin Total: 0.6 mg/dL (ref 0.0–1.2)
CO2: 25 mmol/L (ref 20–29)
Calcium: 9.8 mg/dL (ref 8.7–10.3)
Chloride: 99 mmol/L (ref 96–106)
Creatinine, Ser: 0.9 mg/dL (ref 0.57–1.00)
Globulin, Total: 2.9 g/dL (ref 1.5–4.5)
Glucose: 85 mg/dL (ref 65–99)
Potassium: 3.6 mmol/L (ref 3.5–5.2)
Sodium: 138 mmol/L (ref 134–144)
Total Protein: 7.1 g/dL (ref 6.0–8.5)
eGFR: 72 mL/min/{1.73_m2} (ref >59)

## 2021-03-26 LAB — HEMOGLOBIN A1C
Est. average glucose Bld gHb Est-mCnc: 105 mg/dL
Hgb A1c MFr Bld: 5.3 % (ref 4.8–5.6)

## 2021-03-28 LAB — CYTOLOGY - PAP: Diagnosis: NEGATIVE

## 2021-04-23 ENCOUNTER — Ambulatory Visit
Admission: RE | Admit: 2021-04-23 | Discharge: 2021-04-23 | Disposition: A | Discharge status: Home / Self Care | Payer: 59 | Source: Ambulatory Visit | Attending: Nurse Practitioner | Admitting: Nurse Practitioner

## 2021-04-23 ENCOUNTER — Other Ambulatory Visit: Payer: Self-pay

## 2021-04-23 DIAGNOSIS — Z1231 Encounter for screening mammogram for malignant neoplasm of breast: Secondary | ICD-10-CM | POA: Diagnosis not present

## 2021-04-25 ENCOUNTER — Other Ambulatory Visit (HOSPITAL_COMMUNITY): Payer: Self-pay

## 2021-04-28 ENCOUNTER — Other Ambulatory Visit (HOSPITAL_COMMUNITY): Payer: Self-pay

## 2021-04-28 ENCOUNTER — Encounter: Payer: Self-pay | Admitting: Nurse Practitioner

## 2021-04-30 ENCOUNTER — Other Ambulatory Visit (HOSPITAL_COMMUNITY): Payer: Self-pay

## 2021-05-02 ENCOUNTER — Telehealth: Payer: Self-pay

## 2021-05-02 NOTE — Telephone Encounter (Signed)
I called and left pt vm letting her know her PA for wegovy has been approved through 04/28/22. YL,RMA

## 2021-05-05 ENCOUNTER — Other Ambulatory Visit (HOSPITAL_COMMUNITY): Payer: Self-pay

## 2021-05-27 ENCOUNTER — Encounter: Payer: Self-pay | Admitting: Nurse Practitioner

## 2021-05-27 ENCOUNTER — Other Ambulatory Visit: Payer: Self-pay

## 2021-05-27 ENCOUNTER — Other Ambulatory Visit (HOSPITAL_COMMUNITY): Payer: Self-pay

## 2021-05-27 ENCOUNTER — Ambulatory Visit: Payer: 59 | Admitting: Nurse Practitioner

## 2021-05-27 VITALS — BP 110/80 | HR 72 | Temp 98.2°F | Ht 60.0 in | Wt 163.2 lb

## 2021-05-27 DIAGNOSIS — Z6831 Body mass index (BMI) 31.0-31.9, adult: Secondary | ICD-10-CM

## 2021-05-27 DIAGNOSIS — E6609 Other obesity due to excess calories: Secondary | ICD-10-CM | POA: Diagnosis not present

## 2021-05-27 DIAGNOSIS — I10 Essential (primary) hypertension: Secondary | ICD-10-CM | POA: Diagnosis not present

## 2021-05-27 MED ORDER — SEMAGLUTIDE-WEIGHT MANAGEMENT 2.4 MG/0.75ML ~~LOC~~ SOAJ
2.4000 mg | SUBCUTANEOUS | 1 refills | Status: DC
  Filled 2021-05-27: qty 3, 28d supply, fill #0
  Filled 2021-06-18: qty 3, 28d supply, fill #1

## 2021-05-27 NOTE — Progress Notes (Signed)
I,Nicolasa Milbrath,acting as a Education administrator for [name], FNP.,have documented all relevant documentation on the behalf of [name], FNP,as directed by  [name], FNP while in the presence of [name], [place].  This visit occurred during the SARS-CoV-2 public health emergency.  Safety protocols were in place, including screening questions prior to the visit, additional usage of staff PPE, and extensive cleaning of exam room while observing appropriate contact time as indicated for disinfecting solutions.  Subjective:     Patient ID: Catherine Fritz , female    DOB: Mar 15, 1961 , 60 y.o.   MRN: [id number]   Chief Complaint  Patient presents with   Obesity     HPI  Here for weight check. She has no concerns or questions at the moment. Continues to walk 3 miles 3-4 times a week. She is strength training on Wednesday. She is thinking about taking a swimming class. She does about 120 or 150 squats per day.   She has a twin sister who has diabetes.    Wt Readings from Last 3 Encounters: 05/27/21 : 163 lb 3.2 oz (74 kg) 03/25/21 : 162 lb 6.4 oz (73.7 kg) 02/03/21 : 164 lb (74.4 kg)    Past Medical History:  Diagnosis Date   Hypertension      Family History  Problem Relation Age of Onset   Breast cancer Paternal Aunt        in 19's   Hypertension Mother    Thyroid disease Mother    Heart disease Father    Cancer Other    Diabetes Sister    Thyroid disease Sister    Diabetes Brother    Kidney disease Brother    Thyroid disease Brother      Current Outpatient Medications:    amLODipine (NORVASC) 5 MG tablet, TAKE 1 TABLET (5 MG TOTAL) BY MOUTH DAILY., Disp: 90 tablet, Rfl: 1   COVID-19 At Home Antigen Test ([facility] COVID-19 HOME TEST) KIT, Use as directed within package instructions. (Patient taking differently: Use as directed within package instructions.), Disp: 2 each, Rfl: 0   MULTIPLE VITAMIN PO, Take 1 tablet by mouth daily., Disp: , Rfl:    fluconazole (DIFLUCAN) 150  MG tablet, TAKE 1 TABLET BY MOUTH ONCE AS A SINGLE DOSE. (Patient not taking: Reported on 03/25/2021), Disp: 1 tablet, Rfl: 0   mupirocin ointment (BACTROBAN) 2 %, APPLY TOPICALLY TO AFFECTED AREA THREE TIMES DAILY FOR 7 DAYS. (Patient not taking: Reported on 03/25/2021), Disp: 22 g, Rfl: 0   Semaglutide-Weight Management 2.4 MG/0.75ML SOAJ, Inject 2.4 mg into the skin once a week., Disp: 3 mL, Rfl: 1   Allergies  Allergen Reactions   Codeine Rash   Contrave [Naltrexone-Bupropion Hcl Er] Rash     Review of Systems  Constitutional: Negative.   Respiratory: Negative.  Negative for shortness of breath and wheezing.   Cardiovascular: Negative.  Negative for chest pain, palpitations and leg swelling.  Neurological: Negative.   Psychiatric/Behavioral: Negative.      Today's Vitals   05/27/21 0923  BP: 110/80  Pulse: 72  Temp: 98.2 F (36.8 C)  Weight: 163 lb 3.2 oz (74 kg)  Height: 5' (1.524 m)   Body mass index is 31.87 kg/m.  Wt Readings from Last 3 Encounters:  05/27/21 163 lb 3.2 oz (74 kg)  03/25/21 162 lb 6.4 oz (73.7 kg)  02/03/21 164 lb (74.4 kg)    Objective:  Physical Exam Vitals reviewed.  Constitutional:      General:  She is not in acute distress.    Appearance: Normal appearance. She is obese.  HENT:     Head: Normocephalic.     Right Ear: There is no impacted cerumen.  Eyes:     Extraocular Movements: Extraocular movements intact.     Pupils: Pupils are equal, round, and reactive to light.  Cardiovascular:     Rate and Rhythm: Normal rate and regular rhythm.     Pulses: Normal pulses.     Heart sounds: Normal heart sounds. No murmur heard. Pulmonary:     Effort: Pulmonary effort is normal. No respiratory distress.     Breath sounds: Normal breath sounds. No wheezing.  Skin:    General: Skin is warm and dry.     Capillary Refill: Capillary refill takes less than 2 seconds.  Neurological:     General: No focal deficit present.     Mental Status: She is  alert and oriented to person, place, and time.     Cranial Nerves: No cranial nerve deficit.     Motor: No weakness.  Psychiatric:        Mood and Affect: Mood normal.        Behavior: Behavior normal.        Thought Content: Thought content normal.        Judgment: Judgment normal.        Assessment And Plan:     1. Class 1 obesity due to excess calories with serious comorbidity and body mass index (BMI) of 31.0 to 31.9 in adult Chronic Discussed healthy diet and regular exercise options  Encouraged to exercise at least 150 minutes per week with 2 days of strength training Continue with Wegovy, tolerating well She is encouraged to strive for BMI less than 30 to decrease cardiac risk.   - Semaglutide-Weight Management 2.4 MG/0.75ML SOAJ; Inject 2.4 mg into the skin once a week.  Dispense: 3 mL; Refill: 1  2. Essential hypertension  B/P is well controlled.  CMP ordered to check renal function.  The importance of regular exercise and dietary modification was stressed to the patient.   Patient was given opportunity to ask questions. Patient verbalized understanding of the plan and was able to repeat key elements of the plan. All questions were answered to their satisfaction.  [name], FNP   I, [name], FNP, have reviewed all documentation for this visit. The documentation on 05/27/21 for the exam, diagnosis, procedures, and orders are all accurate and complete.   IF YOU HAVE BEEN REFERRED TO A SPECIALIST, IT MAY TAKE 1-2 WEEKS TO SCHEDULE/PROCESS THE REFERRAL. IF YOU HAVE NOT HEARD FROM US/SPECIALIST IN TWO WEEKS, PLEASE GIVE [place] A CALL AT [phone].   THE PATIENT IS ENCOURAGED TO PRACTICE SOCIAL DISTANCING DUE TO THE COVID-19 PANDEMIC.

## 2021-05-27 NOTE — Patient Instructions (Signed)
Exercising to Lose Weight Exercise is structured, repetitive physical activity to improve fitness and health. Getting regular exercise is important for everyone. It is especially important if you are overweight. Being overweight increases your risk of heart disease, stroke, diabetes, high blood pressure, and several types of cancer.Reducing your calorie intake and exercising can help you lose weight. Exercise is usually categorized as moderate or vigorous intensity. To lose weight, most people need to do a certain amount of moderate-intensity orvigorous-intensity exercise each week. Moderate-intensity exercise  Moderate-intensity exercise is any activity that gets you moving enough to burn at least three times more energy (calories) than if you were sitting. Examples of moderate exercise include: Walking a mile in 15 minutes. Doing light yard work. Biking at an easy pace. Most people should get at least 150 minutes (2 hours and 30 minutes) a week ofmoderate-intensity exercise to maintain their body weight. Vigorous-intensity exercise Vigorous-intensity exercise is any activity that gets you moving enough to burn at least six times more calories than if you were sitting. When you exercise at this intensity, you should be working hard enough that you are not able tocarry on a conversation. Examples of vigorous exercise include: Running. Playing a team sport, such as football, basketball, and soccer. Jumping rope. Most people should get at least 75 minutes (1 hour and 15 minutes) a week ofvigorous-intensity exercise to maintain their body weight. How can exercise affect me? When you exercise enough to burn more calories than you eat, you lose weight. Exercise also reduces body fat and builds muscle. The more muscle you have, the more calories you burn. Exercise also: Improves mood. Reduces stress and tension. Improves your overall fitness, flexibility, and endurance. Increases bone strength. The  amount of exercise you need to lose weight depends on: Your age. The type of exercise. Any health conditions you have. Your overall physical ability. Talk to your health care provider about how much exercise you need and whattypes of activities are safe for you. What actions can I take to lose weight? Nutrition  Make changes to your diet as told by your health care provider or diet and nutrition specialist (dietitian). This may include: Eating fewer calories. Eating more protein. Eating less unhealthy fats. Eating a diet that includes fresh fruits and vegetables, whole grains, low-fat dairy products, and lean protein. Avoiding foods with added fat, salt, and sugar. Drink plenty of water while you exercise to prevent dehydration or heat stroke.  Activity Choose an activity that you enjoy and set realistic goals. Your health care provider can help you make an exercise plan that works for you. Exercise at a moderate or vigorous intensity most days of the week. The intensity of exercise may vary from person to person. You can tell how intense a workout is for you by paying attention to your breathing and heartbeat. Most people will notice their breathing and heartbeat get faster with more intense exercise. Do resistance training twice each week, such as: Push-ups. Sit-ups. Lifting weights. Using resistance bands. Getting short amounts of exercise can be just as helpful as long structured periods of exercise. If you have trouble finding time to exercise, try to include exercise in your daily routine. Get up, stretch, and walk around every 30 minutes throughout the day. Go for a walk during your lunch break. Park your car farther away from your destination. If you take public transportation, get off one stop early and walk the rest of the way. Make phone calls while standing up and   walking around. Take the stairs instead of elevators or escalators. Wear comfortable clothes and shoes with  good support. Do not exercise so much that you hurt yourself, feel dizzy, or get very short of breath. Where to find more information [place] Department of Health and Human Services: www.hhs.gov Centers for Disease Control and Prevention (CDC): www.cdc.gov Contact a health care provider: Before starting a new exercise program. If you have questions or concerns about your weight. If you have a medical problem that keeps you from exercising. Get help right away if you have any of the following while exercising: Injury. Dizziness. Difficulty breathing or shortness of breath that does not go away when you stop exercising. Chest pain. Rapid heartbeat. Summary Being overweight increases your risk of heart disease, stroke, diabetes, high blood pressure, and several types of cancer. Losing weight happens when you burn more calories than you eat. Reducing the amount of calories you eat in addition to getting regular moderate or vigorous exercise each week helps you lose weight. This information is not intended to replace advice given to you by your health care provider. Make sure you discuss any questions you have with your healthcare provider. Document Revised: 02/05/2020 Document Reviewed: 02/22/2020 Elsevier Patient Education  2022 Elsevier Inc.  

## 2021-05-29 ENCOUNTER — Other Ambulatory Visit (HOSPITAL_COMMUNITY): Payer: Self-pay

## 2021-05-29 ENCOUNTER — Other Ambulatory Visit: Payer: Self-pay | Admitting: Nurse Practitioner

## 2021-05-29 DIAGNOSIS — I10 Essential (primary) hypertension: Secondary | ICD-10-CM

## 2021-05-29 MED ORDER — AMLODIPINE BESYLATE 5 MG PO TABS
5.0000 mg | ORAL_TABLET | Freq: Every day | ORAL | 1 refills | Status: DC
  Filled 2021-05-29: qty 90, 90d supply, fill #0
  Filled 2021-10-13: qty 90, 90d supply, fill #1

## 2021-06-02 ENCOUNTER — Encounter: Payer: Self-pay | Admitting: Nurse Practitioner

## 2021-06-18 ENCOUNTER — Other Ambulatory Visit (HOSPITAL_COMMUNITY): Payer: Self-pay

## 2021-07-12 DIAGNOSIS — H5203 Hypermetropia, bilateral: Secondary | ICD-10-CM | POA: Diagnosis not present

## 2021-07-15 ENCOUNTER — Encounter: Payer: Self-pay | Admitting: Nurse Practitioner

## 2021-07-15 ENCOUNTER — Other Ambulatory Visit: Payer: Self-pay | Admitting: Nurse Practitioner

## 2021-07-15 DIAGNOSIS — E6609 Other obesity due to excess calories: Secondary | ICD-10-CM

## 2021-07-15 DIAGNOSIS — Z6831 Body mass index (BMI) 31.0-31.9, adult: Secondary | ICD-10-CM

## 2021-07-16 ENCOUNTER — Other Ambulatory Visit (HOSPITAL_COMMUNITY): Payer: Self-pay

## 2021-07-16 ENCOUNTER — Other Ambulatory Visit: Payer: Self-pay

## 2021-07-16 DIAGNOSIS — E6609 Other obesity due to excess calories: Secondary | ICD-10-CM

## 2021-07-16 MED ORDER — SEMAGLUTIDE-WEIGHT MANAGEMENT 2.4 MG/0.75ML ~~LOC~~ SOAJ
2.4000 mg | SUBCUTANEOUS | 1 refills | Status: DC
  Filled 2021-07-16: qty 3, 28d supply, fill #0
  Filled 2021-08-27: qty 3, 28d supply, fill #1

## 2021-08-27 ENCOUNTER — Other Ambulatory Visit (HOSPITAL_COMMUNITY): Payer: Self-pay

## 2021-10-06 ENCOUNTER — Other Ambulatory Visit (HOSPITAL_COMMUNITY): Payer: Self-pay

## 2021-10-06 ENCOUNTER — Other Ambulatory Visit: Payer: Self-pay | Admitting: Nurse Practitioner

## 2021-10-06 DIAGNOSIS — Z6831 Body mass index (BMI) 31.0-31.9, adult: Secondary | ICD-10-CM

## 2021-10-06 MED ORDER — WEGOVY 2.4 MG/0.75ML ~~LOC~~ SOAJ
2.4000 mg | SUBCUTANEOUS | 1 refills | Status: DC
  Filled 2021-10-06: qty 3, 28d supply, fill #0
  Filled 2021-11-25: qty 3, 28d supply, fill #1

## 2021-10-13 ENCOUNTER — Other Ambulatory Visit (HOSPITAL_COMMUNITY): Payer: Self-pay

## 2021-11-26 ENCOUNTER — Other Ambulatory Visit (HOSPITAL_COMMUNITY): Payer: Self-pay

## 2021-12-30 ENCOUNTER — Other Ambulatory Visit (HOSPITAL_COMMUNITY): Payer: Self-pay

## 2021-12-30 ENCOUNTER — Other Ambulatory Visit: Payer: Self-pay | Admitting: Nurse Practitioner

## 2021-12-30 DIAGNOSIS — Z6831 Body mass index (BMI) 31.0-31.9, adult: Secondary | ICD-10-CM

## 2021-12-30 DIAGNOSIS — E6609 Other obesity due to excess calories: Secondary | ICD-10-CM

## 2021-12-30 MED ORDER — WEGOVY 2.4 MG/0.75ML ~~LOC~~ SOAJ
2.4000 mg | SUBCUTANEOUS | 1 refills | Status: DC
  Filled 2021-12-30: qty 3, 28d supply, fill #0
  Filled 2022-02-04: qty 3, 28d supply, fill #1

## 2022-01-30 ENCOUNTER — Other Ambulatory Visit: Payer: Self-pay | Admitting: Nurse Practitioner

## 2022-01-30 ENCOUNTER — Other Ambulatory Visit (HOSPITAL_COMMUNITY): Payer: Self-pay

## 2022-01-30 DIAGNOSIS — I10 Essential (primary) hypertension: Secondary | ICD-10-CM

## 2022-01-31 MED ORDER — AMLODIPINE BESYLATE 5 MG PO TABS
5.0000 mg | ORAL_TABLET | Freq: Every day | ORAL | 1 refills | Status: DC
  Filled 2022-01-31: qty 90, 90d supply, fill #0
  Filled 2022-04-29: qty 90, 90d supply, fill #1

## 2022-02-02 ENCOUNTER — Other Ambulatory Visit (HOSPITAL_COMMUNITY): Payer: Self-pay

## 2022-02-03 ENCOUNTER — Other Ambulatory Visit (HOSPITAL_COMMUNITY): Payer: Self-pay

## 2022-02-03 MED ORDER — CARESTART COVID-19 HOME TEST VI KIT
PACK | 0 refills | Status: DC
  Filled 2022-02-03: qty 4, 8d supply, fill #0

## 2022-02-04 ENCOUNTER — Other Ambulatory Visit (HOSPITAL_COMMUNITY): Payer: Self-pay

## 2022-03-17 ENCOUNTER — Other Ambulatory Visit (HOSPITAL_COMMUNITY): Payer: Self-pay

## 2022-03-17 ENCOUNTER — Other Ambulatory Visit: Payer: Self-pay | Admitting: Nurse Practitioner

## 2022-03-17 DIAGNOSIS — E6609 Other obesity due to excess calories: Secondary | ICD-10-CM

## 2022-03-19 ENCOUNTER — Other Ambulatory Visit (HOSPITAL_COMMUNITY): Payer: Self-pay

## 2022-03-19 MED ORDER — WEGOVY 2.4 MG/0.75ML ~~LOC~~ SOAJ
2.4000 mg | SUBCUTANEOUS | 1 refills | Status: DC
  Filled 2022-03-19: qty 3, 28d supply, fill #0
  Filled 2022-04-21: qty 3, 28d supply, fill #1

## 2022-03-26 ENCOUNTER — Encounter: Payer: 59 | Admitting: Nurse Practitioner

## 2022-04-21 ENCOUNTER — Other Ambulatory Visit (HOSPITAL_COMMUNITY): Payer: Self-pay

## 2022-04-29 ENCOUNTER — Other Ambulatory Visit (HOSPITAL_COMMUNITY): Payer: Self-pay

## 2022-04-30 ENCOUNTER — Other Ambulatory Visit: Payer: Self-pay | Admitting: Nurse Practitioner

## 2022-04-30 DIAGNOSIS — Z1231 Encounter for screening mammogram for malignant neoplasm of breast: Secondary | ICD-10-CM

## 2022-05-04 ENCOUNTER — Ambulatory Visit
Admission: RE | Admit: 2022-05-04 | Discharge: 2022-05-04 | Disposition: A | Discharge status: Home / Self Care | Payer: 59 | Source: Ambulatory Visit | Attending: Nurse Practitioner | Admitting: Nurse Practitioner

## 2022-05-04 DIAGNOSIS — Z1231 Encounter for screening mammogram for malignant neoplasm of breast: Secondary | ICD-10-CM

## 2022-06-05 ENCOUNTER — Other Ambulatory Visit: Payer: Self-pay | Admitting: Nurse Practitioner

## 2022-06-05 DIAGNOSIS — E6609 Other obesity due to excess calories: Secondary | ICD-10-CM

## 2022-06-06 ENCOUNTER — Other Ambulatory Visit: Payer: Self-pay | Admitting: Nurse Practitioner

## 2022-06-06 DIAGNOSIS — E6609 Other obesity due to excess calories: Secondary | ICD-10-CM

## 2022-06-08 ENCOUNTER — Other Ambulatory Visit (HOSPITAL_COMMUNITY): Payer: Self-pay

## 2022-06-08 ENCOUNTER — Other Ambulatory Visit: Payer: Self-pay | Admitting: Nurse Practitioner

## 2022-06-08 ENCOUNTER — Telehealth: Payer: Self-pay

## 2022-06-08 DIAGNOSIS — E6609 Other obesity due to excess calories: Secondary | ICD-10-CM

## 2022-06-11 NOTE — Telephone Encounter (Signed)
error 

## 2022-07-09 ENCOUNTER — Ambulatory Visit (INDEPENDENT_AMBULATORY_CARE_PROVIDER_SITE_OTHER): Payer: 59 | Admitting: Nurse Practitioner

## 2022-07-09 ENCOUNTER — Encounter: Payer: Self-pay | Admitting: Nurse Practitioner

## 2022-07-09 ENCOUNTER — Other Ambulatory Visit (HOSPITAL_COMMUNITY): Payer: Self-pay

## 2022-07-09 VITALS — BP 128/72 | HR 69 | Temp 98.0°F | Ht 60.0 in | Wt 165.0 lb

## 2022-07-09 DIAGNOSIS — E6609 Other obesity due to excess calories: Secondary | ICD-10-CM | POA: Diagnosis not present

## 2022-07-09 DIAGNOSIS — I10 Essential (primary) hypertension: Secondary | ICD-10-CM

## 2022-07-09 DIAGNOSIS — Z6832 Body mass index (BMI) 32.0-32.9, adult: Secondary | ICD-10-CM | POA: Diagnosis not present

## 2022-07-09 DIAGNOSIS — Z0000 Encounter for general adult medical examination without abnormal findings: Secondary | ICD-10-CM

## 2022-07-09 DIAGNOSIS — R7303 Prediabetes: Secondary | ICD-10-CM | POA: Diagnosis not present

## 2022-07-09 DIAGNOSIS — E782 Mixed hyperlipidemia: Secondary | ICD-10-CM

## 2022-07-09 DIAGNOSIS — Z79899 Other long term (current) drug therapy: Secondary | ICD-10-CM

## 2022-07-09 LAB — POCT URINALYSIS DIPSTICK
Bilirubin, UA: NEGATIVE
Blood, UA: NEGATIVE
Glucose, UA: NEGATIVE
Ketones, UA: NEGATIVE
Leukocytes, UA: NEGATIVE
Nitrite, UA: NEGATIVE
Protein, UA: NEGATIVE
Spec Grav, UA: 1.02 (ref 1.010–1.025)
Urobilinogen, UA: 0.2 E.U./dL
pH, UA: 7 (ref 5.0–8.0)

## 2022-07-09 MED ORDER — WEGOVY 1.7 MG/0.75ML ~~LOC~~ SOAJ
1.7000 mg | SUBCUTANEOUS | 1 refills | Status: DC
  Filled 2022-07-09: qty 3, 28d supply, fill #0
  Filled 2022-07-26: qty 3, 30d supply, fill #0
  Filled 2022-07-27 – 2022-09-07 (×2): qty 3, 28d supply, fill #0

## 2022-07-09 NOTE — Progress Notes (Signed)
I,Tianna Badgett,acting as a Education administrator for [employer], FNP.,have documented all relevant documentation on the behalf of [name], FNP,as directed by  [name], FNP while in the presence of [name], [place].  Subjective:     Patient ID: Catherine Fritz , female    DOB: [birth year] , 61 y.o.   MRN: [id number]   Chief Complaint  Patient presents with   Annual Exam    HPI  Pt is here for HM.    Wt Readings from Last 3 Encounters: 07/09/22 : 165 lb (74.8 kg) 05/27/21 : 163 lb 3.2 oz (74 kg) 03/25/21 : 162 lb 6.4 oz (73.7 kg)    Hypertension This is a chronic problem. The current episode started more than 1 year ago. The problem has been gradually worsening since onset. The problem is controlled. Pertinent negatives include no anxiety, chest pain, headaches, palpitations or shortness of breath. Risk factors for coronary artery disease include sedentary lifestyle and obesity. Past treatments include calcium channel blockers and diuretics. The current treatment provides significant improvement. Compliance problems include exercise.  There is no history of kidney disease. There is no history of chronic renal disease.     Past Medical History:  Diagnosis Date   Hypertension      Family History  Problem Relation Age of Onset   Breast cancer Paternal Aunt        in 52's   Hypertension Mother    Thyroid disease Mother    Heart disease Father    Cancer Other    Diabetes Sister    Thyroid disease Sister    Diabetes Brother    Kidney disease Brother    Thyroid disease Brother      Current Outpatient Medications:    Semaglutide-Weight Management (WEGOVY) 1.7 MG/0.75ML SOAJ, Inject 1.7 mg into the skin once a week., Disp: 3 mL, Rfl: 1   amLODipine (NORVASC) 5 MG tablet, Take 1 tablet (5 mg total) by mouth daily., Disp: 90 tablet, Rfl: 1   COVID-19 At Home Antigen Test ([facility] COVID-19 HOME TEST) KIT, Use as directed within package instructions. (Patient taking differently:  Use as directed within package instructions.), Disp: 2 each, Rfl: 0   COVID-19 At Home Antigen Test ([facility] COVID-19 HOME TEST) KIT, Use as directed., Disp: 4 each, Rfl: 0   MULTIPLE VITAMIN PO, Take 1 tablet by mouth daily., Disp: , Rfl:    Allergies  Allergen Reactions   Codeine Rash   Contrave [Naltrexone-Bupropion Hcl Er] Rash      The patient states she uses tubal ligation for birth control.  Patient's last menstrual period was 09/07/2012.. Negative for Dysmenorrhea and Negative for Menorrhagia. Negative for: breast discharge, breast lump(s), breast pain and breast self exam. Associated symptoms include abnormal vaginal bleeding. Pertinent negatives include abnormal bleeding (hematology), anxiety, decreased libido, depression, difficulty falling sleep, dyspareunia, history of infertility, nocturia, sexual dysfunction, sleep disturbances, urinary incontinence, urinary urgency, vaginal discharge and vaginal itching. Diet regular.  She had been struggling with her diet since her nephew passing and her brother had been sick. She has another brother with colon cancer and had colon surgery. From May to August. She is now back on track. The patient states her exercise level is moderate with swimming 2 times a week and stretching.   The patient's tobacco use is:  Social History   Tobacco Use  Smoking Status Never  Smokeless Tobacco Never   She has been exposed to passive smoke. The patient's alcohol use is:  Social  History   Substance and Sexual Activity  Alcohol Use No   Additional information: Last pap 2022, next one scheduled for 2025.    Review of Systems  Constitutional: Negative.   HENT: Negative.    Eyes: Negative.   Respiratory: Negative.  Negative for shortness of breath.   Cardiovascular: Negative.  Negative for chest pain, palpitations and leg swelling.  Gastrointestinal: Negative.   Endocrine: Negative.   Genitourinary: Negative.   Musculoskeletal: Negative.   Skin:  Negative.   Allergic/Immunologic: Negative.   Neurological: Negative.  Negative for headaches.  Hematological: Negative.   Psychiatric/Behavioral: Negative.       Today's Vitals   07/09/22 1038  BP: 128/72  Pulse: 69  Temp: 98 F (36.7 C)  TempSrc: Oral  Weight: 165 lb (74.8 kg)  Height: 5' (1.524 m)   Body mass index is 32.22 kg/m.  Wt Readings from Last 3 Encounters:  07/09/22 165 lb (74.8 kg)  05/27/21 163 lb 3.2 oz (74 kg)  03/25/21 162 lb 6.4 oz (73.7 kg)    Objective:  Physical Exam Vitals reviewed.  Constitutional:      General: She is not in acute distress.    Appearance: Normal appearance. She is well-developed. She is obese.  HENT:     Head: Normocephalic and atraumatic.     Right Ear: Hearing, tympanic membrane, ear canal and external ear normal. There is no impacted cerumen.     Left Ear: Hearing, tympanic membrane, ear canal and external ear normal. There is no impacted cerumen.     Nose:     Comments: Deferred - masked    Mouth/Throat:     Comments: Deferred - masked Eyes:     General: Lids are normal.     Extraocular Movements: Extraocular movements intact.     Conjunctiva/sclera: Conjunctivae normal.     Pupils: Pupils are equal, round, and reactive to light.     Funduscopic exam:    Right eye: No papilledema.        Left eye: No papilledema.  Neck:     Thyroid: No thyroid mass.     Vascular: No carotid bruit.  Cardiovascular:     Rate and Rhythm: Normal rate and regular rhythm.     Pulses: Normal pulses.     Heart sounds: Normal heart sounds. No murmur heard. Pulmonary:     Effort: Pulmonary effort is normal. No respiratory distress.     Breath sounds: Normal breath sounds. No wheezing.  Chest:     Chest wall: No mass.  Breasts:    Tanner Score is 5.     Right: Normal. No mass or tenderness.     Left: Normal. No mass or tenderness.  Abdominal:     General: Abdomen is flat. Bowel sounds are normal. There is no distension.      Palpations: Abdomen is soft.     Tenderness: There is no abdominal tenderness.  Genitourinary:    Vagina: Normal.     Cervix: Normal.     Uterus: Normal.      Adnexa: Right adnexa normal and left adnexa normal.       Right: No mass or tenderness.       Rectum: Normal.  Musculoskeletal:        General: No swelling or tenderness. Normal range of motion.     Cervical back: Full passive range of motion without pain, normal range of motion and neck supple.     Right lower leg: No edema.  Left lower leg: No edema.  Lymphadenopathy:     Upper Body:     Right upper body: No supraclavicular, axillary or pectoral adenopathy.     Left upper body: No supraclavicular, axillary or pectoral adenopathy.  Skin:    General: Skin is warm and dry.     Capillary Refill: Capillary refill takes less than 2 seconds.  Neurological:     General: No focal deficit present.     Mental Status: She is alert and oriented to person, place, and time.     Cranial Nerves: No cranial nerve deficit.     Sensory: No sensory deficit.     Motor: No weakness.  Psychiatric:        Mood and Affect: Mood normal.        Behavior: Behavior normal.        Thought Content: Thought content normal.        Judgment: Judgment normal.         Assessment And Plan:     1. Health maintenance examination Behavior modifications discussed and diet history reviewed.   Pt will continue to exercise regularly and modify diet with low GI, plant based foods and decrease intake of processed foods.  Recommend intake of daily multivitamin, Vitamin D, and calcium.  Recommend mammogram and colonoscopy for preventive screenings, as well as recommend immunizations that include influenza, TDAP, and Shingles  2. Essential hypertension Comments: Blood pressure is well controlled, continue current medications. EKG done with NSR HR 67 - POCT Urinalysis Dipstick (81002) - Microalbumin / Creatinine Urine Ratio - EKG 12-Lead - CMP14+EGFR  3.  Mixed hyperlipidemia Comments: Cholesterol levels are stable, continue focusing on low fat diet. - CMP14+EGFR - Lipid panel  4. Prediabetes Comments: Stable, diet controlled. Encouraged to exercise regularly - Hemoglobin A1c - CMP14+EGFR  5. Class 1 obesity due to excess calories without serious comorbidity with body mass index (BMI) of 32.0 to 32.9 in adult Chronic Discussed healthy diet and regular exercise options  Encouraged to exercise at least 150 minutes per week with 2 days of strength training She has been on Wegovy 1 mg, advised must follow up every 2 months for weight check.  Wt Readings from Last 3 Encounters:  07/09/22 165 lb (74.8 kg)  05/27/21 163 lb 3.2 oz (74 kg)  03/25/21 162 lb 6.4 oz (73.7 kg)  Weight has increased since her last visit.  She is encouraged to strive for BMI less than 30 to decrease cardiac risk. Advised to aim for at least 150 minutes of exercise per week. - Semaglutide-Weight Management (WEGOVY) 1.7 MG/0.75ML SOAJ; Inject 1.7 mg into the skin once a week.  Dispense: 3 mL; Refill: 1  6. Other long term (current) drug therapy - CBC   Patient was given opportunity to ask questions. Patient verbalized understanding of the plan and was able to repeat key elements of the plan. All questions were answered to their satisfaction.   [name], FNP   I, [name], FNP, have reviewed all documentation for this visit. The documentation on 07/09/22 for the exam, diagnosis, procedures, and orders are all accurate and complete.  THE PATIENT IS ENCOURAGED TO PRACTICE SOCIAL DISTANCING DUE TO THE COVID-19 PANDEMIC.

## 2022-07-09 NOTE — Patient Instructions (Signed)

## 2022-07-10 LAB — LIPID PANEL
Chol/HDL Ratio: 3.1 ratio (ref 0.0–4.4)
Cholesterol, Total: 246 mg/dL — ABNORMAL HIGH (ref 100–199)
HDL: 80 mg/dL (ref >39)
LDL Chol Calc (NIH): 152 mg/dL — ABNORMAL HIGH (ref 0–99)
Triglycerides: 83 mg/dL (ref 0–149)
VLDL Cholesterol Cal: 14 mg/dL (ref 5–40)

## 2022-07-10 LAB — CMP14+EGFR
ALT: 49 IU/L — ABNORMAL HIGH (ref 0–32)
AST: 35 IU/L (ref 0–40)
Albumin/Globulin Ratio: 1.7 (ref 1.2–2.2)
Albumin: 4.5 g/dL (ref 3.9–4.9)
Alkaline Phosphatase: 119 IU/L (ref 44–121)
BUN/Creatinine Ratio: 16 (ref 12–28)
BUN: 17 mg/dL (ref 8–27)
Bilirubin Total: 0.6 mg/dL (ref 0.0–1.2)
CO2: 23 mmol/L (ref 20–29)
Calcium: 9.6 mg/dL (ref 8.7–10.3)
Chloride: 98 mmol/L (ref 96–106)
Creatinine, Ser: 1.04 mg/dL — ABNORMAL HIGH (ref 0.57–1.00)
Globulin, Total: 2.6 g/dL (ref 1.5–4.5)
Glucose: 87 mg/dL (ref 70–99)
Potassium: 3.8 mmol/L (ref 3.5–5.2)
Sodium: 138 mmol/L (ref 134–144)
Total Protein: 7.1 g/dL (ref 6.0–8.5)
eGFR: 60 mL/min/{1.73_m2} (ref >59)

## 2022-07-10 LAB — CBC
Hematocrit: 42 % (ref 34.0–46.6)
Hemoglobin: 13.9 g/dL (ref 11.1–15.9)
MCH: 26.5 pg — ABNORMAL LOW (ref 26.6–33.0)
MCHC: 33.1 g/dL (ref 31.5–35.7)
MCV: 80 fL (ref 79–97)
Platelets: 270 10*3/uL (ref 150–450)
RBC: 5.24 x10E6/uL (ref 3.77–5.28)
RDW: 13.2 % (ref 11.7–15.4)
WBC: 4.7 10*3/uL (ref 3.4–10.8)

## 2022-07-10 LAB — MICROALBUMIN / CREATININE URINE RATIO
Creatinine, Urine: 108.6 mg/dL
Microalb/Creat Ratio: 9 mg/g creat (ref 0–29)
Microalbumin, Urine: 10.1 ug/mL

## 2022-07-10 LAB — HEMOGLOBIN A1C
Est. average glucose Bld gHb Est-mCnc: 111 mg/dL
Hgb A1c MFr Bld: 5.5 % (ref 4.8–5.6)

## 2022-07-13 DIAGNOSIS — H5203 Hypermetropia, bilateral: Secondary | ICD-10-CM | POA: Diagnosis not present

## 2022-07-26 ENCOUNTER — Other Ambulatory Visit: Payer: Self-pay | Admitting: Nurse Practitioner

## 2022-07-26 DIAGNOSIS — I10 Essential (primary) hypertension: Secondary | ICD-10-CM

## 2022-07-27 ENCOUNTER — Other Ambulatory Visit (HOSPITAL_COMMUNITY): Payer: Self-pay

## 2022-07-27 MED ORDER — AMLODIPINE BESYLATE 5 MG PO TABS
5.0000 mg | ORAL_TABLET | Freq: Every day | ORAL | 1 refills | Status: DC
  Filled 2022-07-27: qty 90, 90d supply, fill #0
  Filled 2022-10-26: qty 90, 90d supply, fill #1

## 2022-07-30 ENCOUNTER — Other Ambulatory Visit (HOSPITAL_COMMUNITY): Payer: Self-pay

## 2022-09-07 ENCOUNTER — Other Ambulatory Visit (HOSPITAL_COMMUNITY): Payer: Self-pay

## 2022-09-08 ENCOUNTER — Encounter: Payer: Self-pay | Admitting: Nurse Practitioner

## 2022-09-08 ENCOUNTER — Ambulatory Visit (INDEPENDENT_AMBULATORY_CARE_PROVIDER_SITE_OTHER): Payer: 59 | Admitting: Nurse Practitioner

## 2022-09-08 VITALS — BP 140/80 | HR 72 | Temp 98.1°F | Ht 60.0 in | Wt 170.2 lb

## 2022-09-08 DIAGNOSIS — R7303 Prediabetes: Secondary | ICD-10-CM | POA: Diagnosis not present

## 2022-09-08 DIAGNOSIS — Z6832 Body mass index (BMI) 32.0-32.9, adult: Secondary | ICD-10-CM

## 2022-09-08 DIAGNOSIS — R21 Rash and other nonspecific skin eruption: Secondary | ICD-10-CM

## 2022-09-08 DIAGNOSIS — E6609 Other obesity due to excess calories: Secondary | ICD-10-CM | POA: Diagnosis not present

## 2022-09-08 DIAGNOSIS — I10 Essential (primary) hypertension: Secondary | ICD-10-CM | POA: Diagnosis not present

## 2022-09-08 DIAGNOSIS — Z2821 Immunization not carried out because of patient refusal: Secondary | ICD-10-CM

## 2022-09-08 NOTE — Patient Instructions (Signed)

## 2022-09-08 NOTE — Progress Notes (Signed)
Barnet [place] Martin,acting as a Education administrator for [name], FNP.,have documented all relevant documentation on the behalf of [name], FNP,as directed by  [name], FNP while in the presence of [name], [place].    Subjective:     Patient ID: Catherine Fritz , female    DOB: 1961/07/01 , 61 y.o.   MRN: [id number]   Chief Complaint  Patient presents with   Weight Check    HPI  Patient presents today for a weight check, patient states compliance with medications and has no other issues today. Patient hasnt had any Wegovy in a long time. She states she used a sample of Ozempic but believed it didn't do anything.   She has recently returned from [place] for a wedding. Had fish and cabbage. She has had about 4 cups of water in last 24 hours.   Once she came off the [facility] the craving for carbs has increased and  She broke out in a rash with Contrave. She has not been to weight management clinic in the past.   Wt Readings from Last 3 Encounters: 09/08/22 : 170 lb 3.2 oz (77.2 kg) 07/09/22 : 165 lb (74.8 kg) 05/27/21 : 163 lb 3.2 oz (74 kg)   BP Readings from Last 3 Encounters: 09/08/22 : (!) 160/80 07/09/22 : 128/72 05/27/21 : 110/80       Past Medical History:  Diagnosis Date   Hypertension      Family History  Problem Relation Age of Onset   Breast cancer Paternal Aunt        in 29's   Hypertension Mother    Thyroid disease Mother    Heart disease Father    Cancer Other    Diabetes Sister    Thyroid disease Sister    Diabetes Brother    Kidney disease Brother    Thyroid disease Brother      Current Outpatient Medications:    amLODipine (NORVASC) 5 MG tablet, Take 1 tablet (5 mg total) by mouth daily., Disp: 90 tablet, Rfl: 1   MULTIPLE VITAMIN PO, Take 1 tablet by mouth daily., Disp: , Rfl:    COVID-19 At Home Antigen Test [facility] COVID-19 HOME TEST) KIT, Use as directed within package instructions. (Patient not taking: Reported on 09/08/2022), Disp: 2  each, Rfl: 0   Allergies  Allergen Reactions   Codeine Rash   Contrave [Naltrexone-Bupropion Hcl Er] Rash     Review of Systems  Constitutional: Negative.   HENT: Negative.    Eyes: Negative.   Respiratory: Negative.    Cardiovascular: Negative.   Gastrointestinal: Negative.   Neurological: Negative.   Psychiatric/Behavioral: Negative.       Today's Vitals   09/08/22 1624 09/08/22 1715  BP: (!) 160/80 (!) 140/80  Pulse: 72   Temp: 98.1 F (36.7 C)   TempSrc: Oral   Weight: 170 lb 3.2 oz (77.2 kg)   Height: 5' (1.524 m)   PainSc: 0-No pain    Body mass index is 33.24 kg/m.  Wt Readings from Last 3 Encounters:  09/08/22 170 lb 3.2 oz (77.2 kg)  07/09/22 165 lb (74.8 kg)  05/27/21 163 lb 3.2 oz (74 kg)    Objective:  Physical Exam Vitals reviewed.  Constitutional:      General: She is not in acute distress.    Appearance: Normal appearance. She is obese.  HENT:     Head: Normocephalic.     Right Ear: There is no impacted cerumen.  Eyes:  Extraocular Movements: Extraocular movements intact.     Pupils: Pupils are equal, round, and reactive to light.  Cardiovascular:     Rate and Rhythm: Normal rate and regular rhythm.     Pulses: Normal pulses.     Heart sounds: Normal heart sounds. No murmur heard. Pulmonary:     Effort: Pulmonary effort is normal. No respiratory distress.     Breath sounds: Normal breath sounds. No wheezing.  Skin:    General: Skin is warm and dry.     Capillary Refill: Capillary refill takes less than 2 seconds.  Neurological:     General: No focal deficit present.     Mental Status: She is alert and oriented to person, place, and time.     Cranial Nerves: No cranial nerve deficit.     Motor: No weakness.  Psychiatric:        Mood and Affect: Mood normal.        Behavior: Behavior normal.        Thought Content: Thought content normal.        Judgment: Judgment normal.         Assessment And Plan:     1. Uncontrolled  hypertension Comments: Blood pressure is improved with second check but still remains elevated. She is encouraged to take her medications regularly.  May need to increase amlodipine - TSH  2. Prediabetes Comments: Stable levels. Continue focusing on healthy diet low in sugar and starches and sugars  3. Class 1 obesity due to excess calories without serious comorbidity with body mass index (BMI) of 32.0 to 32.9 in adult Comments: She was not approved for [facility] due to increasing her weight. I have encouraged her to continue focusing on exercising regulary and healthy diet. - Insulin, random  4. Herpes zoster vaccination declined  5. Rash of face - ANA, IFA (with reflex)    Patient was given opportunity to ask questions. Patient verbalized understanding of the plan and was able to repeat key elements of the plan. All questions were answered to their satisfaction.  [name], FNP   I, [name], FNP, have reviewed all documentation for this visit. The documentation on 09/08/22 for the exam, diagnosis, procedures, and orders are all accurate and complete.   IF YOU HAVE BEEN REFERRED TO A SPECIALIST, IT MAY TAKE 1-2 WEEKS TO SCHEDULE/PROCESS THE REFERRAL. IF YOU HAVE NOT HEARD FROM US/SPECIALIST IN TWO WEEKS, PLEASE GIVE [place] A CALL AT [phone] X [phone].   THE PATIENT IS ENCOURAGED TO PRACTICE SOCIAL DISTANCING DUE TO THE COVID-19 PANDEMIC.

## 2022-09-11 LAB — INSULIN, RANDOM: INSULIN: 6 u[IU]/mL (ref 2.6–24.9)

## 2022-09-11 LAB — ANTINUCLEAR ANTIBODIES, IFA: ANA Titer 1: NEGATIVE

## 2022-09-11 LAB — TSH: TSH: 1.36 u[IU]/mL (ref 0.450–4.500)

## 2022-09-20 DIAGNOSIS — E6609 Other obesity due to excess calories: Secondary | ICD-10-CM | POA: Insufficient documentation

## 2022-11-19 ENCOUNTER — Ambulatory Visit (INDEPENDENT_AMBULATORY_CARE_PROVIDER_SITE_OTHER): Payer: BC Managed Care – PPO | Admitting: Nurse Practitioner

## 2022-11-19 ENCOUNTER — Encounter: Payer: Self-pay | Admitting: Nurse Practitioner

## 2022-11-19 ENCOUNTER — Other Ambulatory Visit (HOSPITAL_COMMUNITY): Payer: Self-pay

## 2022-11-19 VITALS — BP 164/96 | HR 79 | Temp 98.4°F | Ht 60.0 in | Wt 181.0 lb

## 2022-11-19 DIAGNOSIS — Z6835 Body mass index (BMI) 35.0-35.9, adult: Secondary | ICD-10-CM

## 2022-11-19 DIAGNOSIS — R7303 Prediabetes: Secondary | ICD-10-CM

## 2022-11-19 DIAGNOSIS — I10 Essential (primary) hypertension: Secondary | ICD-10-CM

## 2022-11-19 DIAGNOSIS — E66812 Obesity, class 2: Secondary | ICD-10-CM

## 2022-11-19 MED ORDER — HYDROCHLOROTHIAZIDE 12.5 MG PO TABS
12.5000 mg | ORAL_TABLET | Freq: Every day | ORAL | 1 refills | Status: DC
  Filled 2022-11-19: qty 90, 90d supply, fill #0

## 2022-11-19 MED ORDER — ZEPBOUND 2.5 MG/0.5ML ~~LOC~~ SOAJ
2.5000 mg | SUBCUTANEOUS | 0 refills | Status: DC
  Filled 2022-11-19: qty 2, 28d supply, fill #0

## 2022-11-19 NOTE — Progress Notes (Signed)
I,Tianna Badgett,acting as a Education administrator for [employer], FNP.,have documented all relevant documentation on the behalf of [name], FNP,as directed by  [name], FNP while in the presence of [name], [place]. Subjective:     Patient ID: Catherine Fritz , female    DOB: [birth year] , 62 y.o.   MRN: [id number]   Chief Complaint  Patient presents with   Hypertension    HPI  Patient presents today for weight and hypertension follow up. She is trying not to eat a lot of meat due to poor digestion. She will vomit the meat sometimes when she eats red meat.   Wt Readings from Last 3 Encounters: 11/19/22 : 181 lb (82.1 kg) 09/08/22 : 170 lb 3.2 oz (77.2 kg) 07/09/22 : 165 lb (74.8 kg)  She is eating more fruits and vegetables. She is not exercising in the last couple months. She likes to exercise outside. She likes to swim but the pool is not working well.   Hypertension This is a chronic problem. The current episode started more than 1 year ago.     Past Medical History:  Diagnosis Date   Hypertension      Family History  Problem Relation Age of Onset   Breast cancer Paternal Aunt        in 13's   Hypertension Mother    Thyroid disease Mother    Heart disease Father    Cancer Other    Diabetes Sister    Thyroid disease Sister    Diabetes Brother    Kidney disease Brother    Thyroid disease Brother      Current Outpatient Medications:    tirzepatide (ZEPBOUND) 2.5 MG/0.5ML Pen, Inject 2.5 mg into the skin once a week., Disp: 4 mL, Rfl: 0   amLODipine (NORVASC) 5 MG tablet, Take 1 tablet (5 mg total) by mouth daily., Disp: 90 tablet, Rfl: 1   COVID-19 At Home Antigen Test (CARESTART COVID-19 HOME TEST) KIT, Use as directed within package instructions. (Patient not taking: Reported on 09/08/2022), Disp: 2 each, Rfl: 0   hydrochlorothiazide (HYDRODIURIL) 12.5 MG tablet, Take 1 tablet (12.5 mg total) by mouth daily., Disp: 90 tablet, Rfl: 1   MULTIPLE VITAMIN PO, Take 1  tablet by mouth daily., Disp: , Rfl:    Allergies  Allergen Reactions   Codeine Rash   Contrave [Naltrexone-Bupropion Hcl Er] Rash     Review of Systems  Constitutional: Negative.   Respiratory: Negative.    Cardiovascular: Negative.   Gastrointestinal: Negative.   Neurological: Negative.      Today's Vitals   11/19/22 1614 11/19/22 1633  BP: (!) 170/100 (!) 164/96  Pulse: 79   Temp: 98.4 F (36.9 C)   TempSrc: Oral   Weight: 181 lb (82.1 kg)   Height: 5' (1.524 m)    Body mass index is 35.35 kg/m.   Objective:  Physical Exam Vitals reviewed.  Constitutional:      General: She is not in acute distress.    Appearance: Normal appearance. She is obese.  HENT:     Head: Normocephalic.     Right Ear: There is no impacted cerumen.  Eyes:     Extraocular Movements: Extraocular movements intact.     Pupils: Pupils are equal, round, and reactive to light.  Cardiovascular:     Rate and Rhythm: Normal rate and regular rhythm.     Pulses: Normal pulses.     Heart sounds: Normal heart sounds. No murmur heard. Pulmonary:  Effort: Pulmonary effort is normal. No respiratory distress.     Breath sounds: Normal breath sounds. No wheezing.  Skin:    General: Skin is warm and dry.     Capillary Refill: Capillary refill takes less than 2 seconds.  Neurological:     General: No focal deficit present.     Mental Status: She is alert and oriented to person, place, and time.     Cranial Nerves: No cranial nerve deficit.     Motor: No weakness.  Psychiatric:        Mood and Affect: Mood normal.        Behavior: Behavior normal.        Thought Content: Thought content normal.        Judgment: Judgment normal.         Assessment And Plan:     1. Uncontrolled hypertension Comments: BP is elevated, repeat is improved however still elevated.  Will start HCTZ 12.5 mg daily.  Return to visit in 4 weeks for visit.  This is a been weight related - hydrochlorothiazide  (HYDRODIURIL) 12.5 MG tablet; Take 1 tablet (12.5 mg total) by mouth daily.  Dispense: 90 tablet; Refill: 1 - CMP14+EGFR - tirzepatide (ZEPBOUND) 2.5 MG/0.5ML Pen; Inject 2.5 mg into the skin once a week.  Dispense: 4 mL; Refill: 0  2. Prediabetes Comments: Stable, encouraged to eat healthy diet and increase physical activity  3. Class 2 severe obesity due to excess calories with serious comorbidity and body mass index (BMI) of 35.0 to 35.9 in adult [facility]) Comments: She has gained approximately 16 lbs since August, not taking any weight loss medications. Zepbound Rx sent to pharmacy and she printed a coupon. Pending insurance approval.  She is to call the office if approved for nurse visit for teaching.  If not approved will try to get her started back on Saxenda.  Patient was given opportunity to ask questions. Patient verbalized understanding of the plan and was able to repeat key elements of the plan. All questions were answered to their satisfaction.  [name], FNP   I, [name], FNP, have reviewed all documentation for this visit. The documentation on 11/19/22 for the exam, diagnosis, procedures, and orders are all accurate and complete.   IF YOU HAVE BEEN REFERRED TO A SPECIALIST, IT MAY TAKE 1-2 WEEKS TO SCHEDULE/PROCESS THE REFERRAL. IF YOU HAVE NOT HEARD FROM US/SPECIALIST IN TWO WEEKS, PLEASE GIVE [place] A CALL AT [phone].   THE PATIENT IS ENCOURAGED TO PRACTICE SOCIAL DISTANCING DUE TO THE COVID-19 PANDEMIC.

## 2022-11-19 NOTE — Patient Instructions (Signed)
Hypertension, Adult High blood pressure (hypertension) is when the force of blood pumping through the arteries is too strong. The arteries are the blood vessels that carry blood from the heart throughout the body. Hypertension forces the heart to work harder to pump blood and may cause arteries to become narrow or stiff. Untreated or uncontrolled hypertension can lead to a heart attack, heart failure, a stroke, kidney disease, and other problems. A blood pressure reading consists of a higher number over a lower number. Ideally, your blood pressure should be below 120/80. The first ("top") number is called the systolic pressure. It is a measure of the pressure in your arteries as your heart beats. The second ("bottom") number is called the diastolic pressure. It is a measure of the pressure in your arteries as the heart relaxes. What are the causes? The exact cause of this condition is not known. There are some conditions that result in high blood pressure. What increases the risk? Certain factors may make you more likely to develop high blood pressure. Some of these risk factors are under your control, including: Smoking. Not getting enough exercise or physical activity. Being overweight. Having too much fat, sugar, calories, or salt (sodium) in your diet. Drinking too much alcohol. Other risk factors include: Having a personal history of heart disease, diabetes, high cholesterol, or kidney disease. Stress. Having a family history of high blood pressure and high cholesterol. Having obstructive sleep apnea. Age. The risk increases with age. What are the signs or symptoms? High blood pressure may not cause symptoms. Very high blood pressure (hypertensive crisis) may cause: Headache. Fast or irregular heartbeats (palpitations). Shortness of breath. Nosebleed. Nausea and vomiting. Vision changes. Severe chest pain, dizziness, and seizures. How is this diagnosed? This condition is diagnosed by  measuring your blood pressure while you are seated, with your arm resting on a flat surface, your legs uncrossed, and your feet flat on the floor. The cuff of the blood pressure monitor will be placed directly against the skin of your upper arm at the level of your heart. Blood pressure should be measured at least twice using the same arm. Certain conditions can cause a difference in blood pressure between your right and left arms. If you have a high blood pressure reading during one visit or you have normal blood pressure with other risk factors, you may be asked to: Return on a different day to have your blood pressure checked again. Monitor your blood pressure at home for 1 week or longer. If you are diagnosed with hypertension, you may have other blood or imaging tests to help your health care provider understand your overall risk for other conditions. How is this treated? This condition is treated by making healthy lifestyle changes, such as eating healthy foods, exercising more, and reducing your alcohol intake. You may be referred for counseling on a healthy diet and physical activity. Your health care provider may prescribe medicine if lifestyle changes are not enough to get your blood pressure under control and if: Your systolic blood pressure is above 130. Your diastolic blood pressure is above 80. Your personal target blood pressure may vary depending on your medical conditions, your age, and other factors. Follow these instructions at home: Eating and drinking  Eat a diet that is high in fiber and potassium, and low in sodium, added sugar, and fat. An example of this eating plan is called the DASH diet. DASH stands for Dietary Approaches to Stop Hypertension. To eat this way: Eat   plenty of fresh fruits and vegetables. Try to fill one half of your plate at each meal with fruits and vegetables. Eat whole grains, such as whole-wheat pasta, brown rice, or whole-grain bread. Fill about one  fourth of your plate with whole grains. Eat or drink low-fat dairy products, such as skim milk or low-fat yogurt. Avoid fatty cuts of meat, processed or cured meats, and poultry with skin. Fill about one fourth of your plate with lean proteins, such as fish, chicken without skin, beans, eggs, or tofu. Avoid pre-made and processed foods. These tend to be higher in sodium, added sugar, and fat. Reduce your daily sodium intake. Many people with hypertension should eat less than 1,500 mg of sodium a day. Do not drink alcohol if: Your health care provider tells you not to drink. You are pregnant, may be pregnant, or are planning to become pregnant. If you drink alcohol: Limit how much you have to: 0-1 drink a day for women. 0-2 drinks a day for men. Know how much alcohol is in your drink. In the [place] one drink equals one 12 oz bottle of beer (355 mL), one 5 oz glass of wine (148 mL), or one 1 oz glass of hard liquor (44 mL). Lifestyle  Work with your health care provider to maintain a healthy body weight or to lose weight. Ask what an ideal weight is for you. Get at least 30 minutes of exercise that causes your heart to beat faster (aerobic exercise) most days of the week. Activities may include walking, swimming, or biking. Include exercise to strengthen your muscles (resistance exercise), such as Pilates or lifting weights, as part of your weekly exercise routine. Try to do these types of exercises for 30 minutes at least 3 days a week. Do not use any products that contain nicotine or tobacco. These products include cigarettes, chewing tobacco, and vaping devices, such as e-cigarettes. If you need help quitting, ask your health care provider. Monitor your blood pressure at home as told by your health care provider. Keep all follow-up visits. This is important. Medicines Take over-the-counter and prescription medicines only as told by your health care provider. Follow directions carefully. Blood  pressure medicines must be taken as prescribed. Do not skip doses of blood pressure medicine. Doing this puts you at risk for problems and can make the medicine less effective. Ask your health care provider about side effects or reactions to medicines that you should watch for. Contact a health care provider if you: Think you are having a reaction to a medicine you are taking. Have headaches that keep coming back (recurring). Feel dizzy. Have swelling in your ankles. Have trouble with your vision. Get help right away if you: Develop a severe headache or confusion. Have unusual weakness or numbness. Feel faint. Have severe pain in your chest or abdomen. Vomit repeatedly. Have trouble breathing. These symptoms may be an emergency. Get help right away. Call 911. Do not wait to see if the symptoms will go away. Do not drive yourself to the hospital. Summary Hypertension is when the force of blood pumping through your arteries is too strong. If this condition is not controlled, it may put you at risk for serious complications. Your personal target blood pressure may vary depending on your medical conditions, your age, and other factors. For most people, a normal blood pressure is less than 120/80. Hypertension is treated with lifestyle changes, medicines, or a combination of both. Lifestyle changes include losing weight, eating a healthy,   low-sodium diet, exercising more, and limiting alcohol. This information is not intended to replace advice given to you by your health care provider. Make sure you discuss any questions you have with your health care provider. Document Revised: 09/02/2021 Document Reviewed: 09/02/2021 Elsevier Patient Education  2023 Elsevier Inc.  

## 2022-11-20 ENCOUNTER — Other Ambulatory Visit (HOSPITAL_COMMUNITY): Payer: Self-pay

## 2022-11-20 LAB — CMP14+EGFR
ALT: 39 IU/L — ABNORMAL HIGH (ref 0–32)
AST: 30 IU/L (ref 0–40)
Albumin/Globulin Ratio: 1.6 (ref 1.2–2.2)
Albumin: 4.5 g/dL (ref 3.9–4.9)
Alkaline Phosphatase: 143 IU/L — ABNORMAL HIGH (ref 44–121)
BUN/Creatinine Ratio: 16 (ref 12–28)
BUN: 15 mg/dL (ref 8–27)
Bilirubin Total: 0.4 mg/dL (ref 0.0–1.2)
CO2: 25 mmol/L (ref 20–29)
Calcium: 9.7 mg/dL (ref 8.7–10.3)
Chloride: 99 mmol/L (ref 96–106)
Creatinine, Ser: 0.94 mg/dL (ref 0.57–1.00)
Globulin, Total: 2.9 g/dL (ref 1.5–4.5)
Glucose: 88 mg/dL (ref 70–99)
Potassium: 3.7 mmol/L (ref 3.5–5.2)
Sodium: 138 mmol/L (ref 134–144)
Total Protein: 7.4 g/dL (ref 6.0–8.5)
eGFR: 68 mL/min/{1.73_m2} (ref >59)

## 2022-11-23 ENCOUNTER — Other Ambulatory Visit (HOSPITAL_COMMUNITY): Payer: Self-pay

## 2022-12-15 ENCOUNTER — Encounter: Payer: Self-pay | Admitting: Pharmacist

## 2022-12-21 ENCOUNTER — Ambulatory Visit: Payer: BC Managed Care – PPO | Admitting: Nurse Practitioner

## 2022-12-21 ENCOUNTER — Encounter: Payer: Self-pay | Admitting: Nurse Practitioner

## 2022-12-21 VITALS — BP 124/62 | HR 112 | Temp 98.4°F | Ht 60.0 in | Wt 180.0 lb

## 2022-12-21 DIAGNOSIS — Z6835 Body mass index (BMI) 35.0-35.9, adult: Secondary | ICD-10-CM | POA: Diagnosis not present

## 2022-12-21 DIAGNOSIS — K219 Gastro-esophageal reflux disease without esophagitis: Secondary | ICD-10-CM | POA: Insufficient documentation

## 2022-12-21 DIAGNOSIS — L84 Corns and callosities: Secondary | ICD-10-CM | POA: Diagnosis not present

## 2022-12-21 DIAGNOSIS — I10 Essential (primary) hypertension: Secondary | ICD-10-CM | POA: Diagnosis not present

## 2022-12-21 NOTE — Progress Notes (Unsigned)
  I,Sheena H Holbrook,acting as a Education administrator for [name], FNP.,have documented all relevant documentation on the behalf of [name], FNP,as directed by  [name], FNP while in the presence of [name], [place].    Subjective:     Patient ID: Catherine Fritz , female    DOB: 1961/03/03 , 62 y.o.   MRN: [id number]   Chief Complaint  Patient presents with   Weight Check    HPI  Patient presents today for blood pressure check  Patient does report pain to the bottom of her left foot, thinks she stepped on some glass several months ago. Area still tender.   Wt Readings from Last 3 Encounters: 12/21/22 : 180 lb (81.6 kg) 11/19/22 : 181 lb (82.1 kg) 09/08/22 : 170 lb 3.2 oz (77.2 kg)  She is walking and strengthening exercises. She is trying to eat more better with fruits and vegetables. She tried to get zepbound while on [employer]. She is drinking more water - minimum of 5 glasses of water daily - being more consistent. She does not eat a lot of sugars but will still eat an increase in carbs.     Past Medical History:  Diagnosis Date   Hypertension      Family History  Problem Relation Age of Onset   Breast cancer Paternal Aunt        in 18's   Hypertension Mother    Thyroid disease Mother    Heart disease Father    Cancer Other    Diabetes Sister    Thyroid disease Sister    Diabetes Brother    Kidney disease Brother    Thyroid disease Brother      Current Outpatient Medications:    amLODipine (NORVASC) 5 MG tablet, Take 1 tablet (5 mg total) by mouth daily., Disp: 90 tablet, Rfl: 1   hydrochlorothiazide (HYDRODIURIL) 12.5 MG tablet, Take 1 tablet (12.5 mg total) by mouth daily., Disp: 90 tablet, Rfl: 1   MULTIPLE VITAMIN PO, Take 1 tablet by mouth daily., Disp: , Rfl:    Allergies  Allergen Reactions   Codeine Rash   Contrave [Naltrexone-Bupropion Hcl Er] Rash     Review of Systems  All other systems reviewed and are  negative.    There were no vitals filed for this visit. There is no height or weight on file to calculate BMI.   Objective:  Physical Exam      Assessment And Plan:     There are no diagnoses linked to this encounter.    Patient was given opportunity to ask questions. Patient verbalized understanding of the plan and was able to repeat key elements of the plan. All questions were answered to their satisfaction.  Marrian Salvage, [place] Holbrook, CMA, have reviewed all documentation for this visit. The documentation on 12/21/22 for the exam, diagnosis, procedures, and orders are all accurate and complete.   IF YOU HAVE BEEN REFERRED TO A SPECIALIST, IT MAY TAKE 1-2 WEEKS TO SCHEDULE/PROCESS THE REFERRAL. IF YOU HAVE NOT HEARD FROM US/SPECIALIST IN TWO WEEKS, PLEASE GIVE [place] A CALL AT [phone].   THE PATIENT IS ENCOURAGED TO PRACTICE SOCIAL DISTANCING DUE TO THE COVID-19 PANDEMIC.

## 2022-12-22 LAB — BMP8+EGFR
BUN/Creatinine Ratio: 13 (ref 12–28)
BUN: 15 mg/dL (ref 8–27)
CO2: 25 mmol/L (ref 20–29)
Calcium: 10.2 mg/dL (ref 8.7–10.3)
Chloride: 100 mmol/L (ref 96–106)
Creatinine, Ser: 1.13 mg/dL — ABNORMAL HIGH (ref 0.57–1.00)
Glucose: 85 mg/dL (ref 70–99)
Potassium: 3.7 mmol/L (ref 3.5–5.2)
Sodium: 143 mmol/L (ref 134–144)
eGFR: 55 mL/min/{1.73_m2} — ABNORMAL LOW (ref >59)

## 2022-12-24 ENCOUNTER — Encounter: Payer: Self-pay | Admitting: Nurse Practitioner

## 2023-02-02 ENCOUNTER — Ambulatory Visit (INDEPENDENT_AMBULATORY_CARE_PROVIDER_SITE_OTHER): Payer: BC Managed Care – PPO

## 2023-02-02 VITALS — BP 152/72 | HR 85 | Temp 98.0°F | Ht 60.0 in | Wt 180.0 lb

## 2023-02-02 DIAGNOSIS — I10 Essential (primary) hypertension: Secondary | ICD-10-CM

## 2023-02-02 NOTE — Progress Notes (Signed)
Patient presents today for a bp check, patient currently taking amlodipine 5mg . BP Readings from Last 3 Encounters:  02/02/23 (!) 160/110  12/21/22 124/62  11/19/22 (!) 164/96  Patient reports just taking bp meds before arriving.  Per provider- [place] in 1 week. F/u with PCP in May. Take BP meds one hr before appt.

## 2023-02-09 ENCOUNTER — Ambulatory Visit: Payer: BC Managed Care – PPO

## 2023-02-09 VITALS — BP 160/80 | HR 85 | Temp 98.5°F | Ht 60.0 in | Wt 180.0 lb

## 2023-02-09 DIAGNOSIS — I10 Essential (primary) hypertension: Secondary | ICD-10-CM

## 2023-02-09 MED ORDER — AMLODIPINE BESYLATE 10 MG PO TABS: 10.0000 mg | ORAL_TABLET | Freq: Once | ORAL | Status: DC

## 2023-02-09 NOTE — Progress Notes (Signed)
Patient presents today for a BP check, patient taking amLODipine 5mg .  BP Readings from Last 3 Encounters:  02/09/23 (!) 150/82  02/02/23 (!) 152/72  12/21/22 124/62  Per provider- increase her amlodipine to 10 mg daily

## 2023-02-23 ENCOUNTER — Encounter: Payer: Self-pay | Admitting: Nurse Practitioner

## 2023-02-25 ENCOUNTER — Other Ambulatory Visit (HOSPITAL_COMMUNITY): Payer: Self-pay

## 2023-02-25 ENCOUNTER — Other Ambulatory Visit: Payer: Self-pay | Admitting: Nurse Practitioner

## 2023-02-25 DIAGNOSIS — I10 Essential (primary) hypertension: Secondary | ICD-10-CM

## 2023-02-25 MED ORDER — HYDROCHLOROTHIAZIDE 12.5 MG PO TABS
12.5000 mg | ORAL_TABLET | Freq: Every day | ORAL | 1 refills | Status: DC
  Filled 2023-02-25: qty 90, 90d supply, fill #0
  Filled 2023-05-27: qty 90, 90d supply, fill #1

## 2023-03-11 ENCOUNTER — Ambulatory Visit: Payer: BC Managed Care – PPO | Admitting: Nurse Practitioner

## 2023-03-11 ENCOUNTER — Encounter: Payer: Self-pay | Admitting: Nurse Practitioner

## 2023-03-11 VITALS — BP 130/70 | HR 98 | Temp 98.4°F | Ht 60.0 in | Wt 185.0 lb

## 2023-03-11 DIAGNOSIS — Z2821 Immunization not carried out because of patient refusal: Secondary | ICD-10-CM

## 2023-03-11 DIAGNOSIS — L659 Nonscarring hair loss, unspecified: Secondary | ICD-10-CM

## 2023-03-11 DIAGNOSIS — R7303 Prediabetes: Secondary | ICD-10-CM | POA: Diagnosis not present

## 2023-03-11 DIAGNOSIS — R232 Flushing: Secondary | ICD-10-CM | POA: Insufficient documentation

## 2023-03-11 DIAGNOSIS — I10 Essential (primary) hypertension: Secondary | ICD-10-CM

## 2023-03-11 NOTE — Patient Instructions (Signed)
Hypertension, Adult ?Hypertension is another name for high blood pressure. High blood pressure forces your heart to work harder to pump blood. This can cause problems over time. ?There are two numbers in a blood pressure reading. There is a top number (systolic) over a bottom number (diastolic). It is best to have a blood pressure that is below 120/80. ?What are the causes? ?The cause of this condition is not known. Some other conditions can lead to high blood pressure. ?What increases the risk? ?Some lifestyle factors can make you more likely to develop high blood pressure: ?Smoking. ?Not getting enough exercise or physical activity. ?Being overweight. ?Having too much fat, sugar, calories, or salt (sodium) in your diet. ?Drinking too much alcohol. ?Other risk factors include: ?Having any of these conditions: ?Heart disease. ?Diabetes. ?High cholesterol. ?Kidney disease. ?Obstructive sleep apnea. ?Having a family history of high blood pressure and high cholesterol. ?Age. The risk increases with age. ?Stress. ?What are the signs or symptoms? ?High blood pressure may not cause symptoms. Very high blood pressure (hypertensive crisis) may cause: ?Headache. ?Fast or uneven heartbeats (palpitations). ?Shortness of breath. ?Nosebleed. ?Vomiting or feeling like you may vomit (nauseous). ?Changes in how you see. ?Very bad chest pain. ?Feeling dizzy. ?Seizures. ?How is this treated? ?This condition is treated by making healthy lifestyle changes, such as: ?Eating healthy foods. ?Exercising more. ?Drinking less alcohol. ?Your doctor may prescribe medicine if lifestyle changes do not help enough and if: ?Your top number is above 130. ?Your bottom number is above 80. ?Your personal target blood pressure may vary. ?Follow these instructions at home: ?Eating and drinking ? ?If told, follow the DASH eating plan. To follow this plan: ?Fill one half of your plate at each meal with fruits and vegetables. ?Fill one fourth of your plate  at each meal with whole grains. Whole grains include whole-wheat pasta, brown rice, and whole-grain bread. ?Eat or drink low-fat dairy products, such as skim milk or low-fat yogurt. ?Fill one fourth of your plate at each meal with low-fat (lean) proteins. Low-fat proteins include fish, chicken without skin, eggs, beans, and tofu. ?Avoid fatty meat, cured and processed meat, or chicken with skin. ?Avoid pre-made or processed food. ?Limit the amount of salt in your diet to less than 1,500 mg each day. ?Do not drink alcohol if: ?Your doctor tells you not to drink. ?You are pregnant, may be pregnant, or are planning to become pregnant. ?If you drink alcohol: ?Limit how much you have to: ?0-1 drink a day for women. ?0-2 drinks a day for men. ?Know how much alcohol is in your drink. In the [place] one drink equals one 12 oz bottle of beer (355 mL), one 5 oz glass of wine (148 mL), or one 1? oz glass of hard liquor (44 mL). ?Lifestyle ? ?Work with your doctor to stay at a healthy weight or to lose weight. Ask your doctor what the best weight is for you. ?Get at least 30 minutes of exercise that causes your heart to beat faster (aerobic exercise) most days of the week. This may include walking, swimming, or biking. ?Get at least 30 minutes of exercise that strengthens your muscles (resistance exercise) at least 3 days a week. This may include lifting weights or doing Pilates. ?Do not smoke or use any products that contain nicotine or tobacco. If you need help quitting, ask your doctor. ?Check your blood pressure at home as told by your doctor. ?Keep all follow-up visits. ?Medicines ?Take over-the-counter and prescription medicines   only as told by your doctor. Follow directions carefully. ?Do not skip doses of blood pressure medicine. The medicine does not work as well if you skip doses. Skipping doses also puts you at risk for problems. ?Ask your doctor about side effects or reactions to medicines that you should watch  for. ?Contact a doctor if: ?You think you are having a reaction to the medicine you are taking. ?You have headaches that keep coming back. ?You feel dizzy. ?You have swelling in your ankles. ?You have trouble with your vision. ?Get help right away if: ?You get a very bad headache. ?You start to feel mixed up (confused). ?You feel weak or numb. ?You feel faint. ?You have very bad pain in your: ?Chest. ?Belly (abdomen). ?You vomit more than once. ?You have trouble breathing. ?These symptoms may be an emergency. Get help right away. Call 911. ?Do not wait to see if the symptoms will go away. ?Do not drive yourself to the hospital. ?Summary ?Hypertension is another name for high blood pressure. ?High blood pressure forces your heart to work harder to pump blood. ?For most people, a normal blood pressure is less than 120/80. ?Making healthy choices can help lower blood pressure. If your blood pressure does not get lower with healthy choices, you may need to take medicine. ?This information is not intended to replace advice given to you by your health care provider. Make sure you discuss any questions you have with your health care provider. ?Document Revised: 08/14/2021 Document Reviewed: 08/14/2021 ?Elsevier Patient Education ? 2023 Elsevier Inc. ? ?

## 2023-03-11 NOTE — Progress Notes (Signed)
Hershal Coria Martin,acting as a Neurosurgeon for Arnette Felts, FNP.,have documented all relevant documentation on the behalf of Arnette Felts, FNP,as directed by  Arnette Felts, FNP while in the presence of Arnette Felts, FNP.    Subjective:     Patient ID: Catherine Fritz , female    DOB: October 19, 1961 , 62 y.o.   MRN: [id number]   Chief Complaint  Patient presents with   Hypertension    HPI  Patient presents today for BP check, patient states compliance with medications and has no other concerns today. She has noticed more hot flashes when she drinks caffeine which will cause her to wake up. 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   BP Readings from Last 3 Encounters: 03/11/23 : (!) 140/80 02/09/23 : (!) 160/80 02/02/23 : (!) 152/72  Wt Readings from Last 3 Encounters: 03/11/23 : 185 lb (83.9 kg) 02/09/23 : 180 lb (81.6 kg) 02/02/23 : 180 lb (81.6 kg)  [facility] Her daughter helps her with stretching and yoga.   Hypertension This is a chronic problem. The current episode started more than 1 year ago. The problem has been gradually worsening since onset. The problem is controlled. Pertinent negatives include no anxiety, chest pain, headaches, palpitations or shortness of breath. Risk factors for coronary artery disease include sedentary lifestyle and obesity. Past treatments include calcium channel blockers and diuretics. The current treatment provides significant improvement. Compliance problems include exercise.  There is no history of kidney disease. There is no history of chronic renal disease.     Past Medical History:  Diagnosis Date   Hypertension      Family History  Problem Relation Age of Onset   Breast cancer Paternal Aunt        in 31's   Hypertension Mother    Thyroid disease Mother    Heart disease Father    Cancer Other    Diabetes Sister    Thyroid disease Sister    Diabetes Brother    Kidney disease Brother    Thyroid  disease Brother      Current Outpatient Medications:    hydrochlorothiazide (HYDRODIURIL) 12.5 MG tablet, Take 1 tablet (12.5 mg total) by mouth daily., Disp: 90 tablet, Rfl: 1   MULTIPLE VITAMIN PO, Take 1 tablet by mouth daily., Disp: , Rfl:    amLODipine (NORVASC) 10 MG tablet, Take 1 tablet (10 mg total) by mouth daily at night., Disp: 90 tablet, Rfl: 1   Allergies  Allergen Reactions   Codeine Rash   Contrave [Naltrexone-Bupropion Hcl Er] Rash     Review of Systems  Constitutional: Negative.   Respiratory: Negative.  Negative for shortness of breath.   Cardiovascular: Negative.  Negative for chest pain and palpitations.  Gastrointestinal: Negative.   Neurological: Negative.  Negative for headaches.  Psychiatric/Behavioral: Negative.       Today's Vitals   03/11/23 1444 03/11/23 1523  BP: (!) 140/80 130/70  Pulse: 98   Temp: 98.4 F (36.9 C)   TempSrc: Oral   Weight: 185 lb (83.9 kg)   Height: 5' (1.524 m)   PainSc: 0-No pain    Body mass index is 36.13 kg/m.  Wt Readings from Last 3 Encounters:  03/11/23 185 lb (83.9 kg)  02/09/23 180 lb (81.6 kg)  02/02/23 180 lb (81.6 kg)    Objective:  Physical Exam Vitals reviewed.  Constitutional:      General:  She is not in acute distress.    Appearance: Normal appearance. She is obese.  HENT:     Head: Normocephalic and atraumatic.  Eyes:     Extraocular Movements: Extraocular movements intact.     Pupils: Pupils are equal, round, and reactive to light.  Cardiovascular:     Rate and Rhythm: Normal rate and regular rhythm.     Pulses: Normal pulses.     Heart sounds: Normal heart sounds. No murmur heard. Pulmonary:     Effort: Pulmonary effort is normal. No respiratory distress.     Breath sounds: Normal breath sounds. No wheezing.  Skin:    General: Skin is warm and dry.     Capillary Refill: Capillary refill takes less than 2 seconds.  Neurological:     General: No focal deficit present.     Mental  Status: She is alert and oriented to person, place, and time.     Cranial Nerves: No cranial nerve deficit.     Motor: No weakness.  Psychiatric:        Mood and Affect: Mood normal.        Behavior: Behavior normal.        Thought Content: Thought content normal.        Judgment: Judgment normal.         Assessment And Plan:     1. Essential hypertension Comments: BP is improved since last visit, slight improvement with recheck. can take black seed oil as a supplement, if remains elevated at next visit will make changes - BMP8+eGFR  2. Prediabetes Comments: Stable, continue focusing on low sugar and carbohydrate diet. - BMP8+eGFR - Hemoglobin A1c  3. Hot flashes Comments: Will check thyroid functions, also encouraged to limit intake of high carb diet late in evening.  4. Hair loss Comments: Will check thyroid functions or HCTZ. Continue amlodipine. - TSH - Thyroid Peroxidase Antibody  5. COVID-19 vaccine dose declined Declines covid 19 vaccine. Discussed risk of covid 38 and if she changes her mind about the vaccine to call the office. Education has been provided regarding the importance of this vaccine but patient still declined. Advised may receive this vaccine at local pharmacy or Health Dept.or vaccine clinic. Aware to provide a copy of the vaccination record if obtained from local pharmacy or Health Dept.  Encouraged to take multivitamin, vitamin d, vitamin c and zinc to increase immune system. Aware can call office if would like to have vaccine here at office. Verbalized acceptance and understanding.  6. Herpes zoster vaccination declined Declines shingrix, educated on disease process and is aware if he changes his mind to notify office   Patient was given opportunity to ask questions. Patient verbalized understanding of the plan and was able to repeat key elements of the plan. All questions were answered to their satisfaction.  Arnette Felts, FNP   I, Arnette Felts,  FNP, have reviewed all documentation for this visit. The documentation on 03/11/23 for the exam, diagnosis, procedures, and orders are all accurate and complete.   IF YOU HAVE BEEN REFERRED TO A SPECIALIST, IT MAY TAKE 1-2 WEEKS TO SCHEDULE/PROCESS THE REFERRAL. IF YOU HAVE NOT HEARD FROM US/SPECIALIST IN TWO WEEKS, PLEASE GIVE [place] A CALL AT [phone].   THE PATIENT IS ENCOURAGED TO PRACTICE SOCIAL DISTANCING DUE TO THE COVID-19 PANDEMIC.

## 2023-03-12 LAB — HEMOGLOBIN A1C
Est. average glucose Bld gHb Est-mCnc: 120 mg/dL
Hgb A1c MFr Bld: 5.8 % — ABNORMAL HIGH (ref 4.8–5.6)

## 2023-03-12 LAB — BMP8+EGFR
BUN/Creatinine Ratio: 16 (ref 12–28)
BUN: 15 mg/dL (ref 8–27)
CO2: 22 mmol/L (ref 20–29)
Calcium: 9.9 mg/dL (ref 8.7–10.3)
Chloride: 98 mmol/L (ref 96–106)
Creatinine, Ser: 0.93 mg/dL (ref 0.57–1.00)
Glucose: 87 mg/dL (ref 70–99)
Potassium: 3.5 mmol/L (ref 3.5–5.2)
Sodium: 138 mmol/L (ref 134–144)
eGFR: 69 mL/min/{1.73_m2} (ref >59)

## 2023-03-12 LAB — THYROID PEROXIDASE ANTIBODY: Thyroperoxidase Ab SerPl-aCnc: 10 IU/mL (ref 0–34)

## 2023-03-12 LAB — TSH: TSH: 1.57 u[IU]/mL (ref 0.450–4.500)

## 2023-03-21 ENCOUNTER — Other Ambulatory Visit: Payer: Self-pay | Admitting: Nurse Practitioner

## 2023-03-22 ENCOUNTER — Other Ambulatory Visit (HOSPITAL_COMMUNITY): Payer: Self-pay

## 2023-03-22 MED ORDER — AMLODIPINE BESYLATE 10 MG PO TABS
10.0000 mg | ORAL_TABLET | Freq: Every day | ORAL | 1 refills | Status: DC
  Filled 2023-03-22: qty 90, 90d supply, fill #0
  Filled 2023-06-15: qty 90, 90d supply, fill #1

## 2023-05-27 ENCOUNTER — Other Ambulatory Visit (HOSPITAL_COMMUNITY): Payer: Self-pay

## 2023-06-04 ENCOUNTER — Other Ambulatory Visit: Payer: Self-pay | Admitting: Nurse Practitioner

## 2023-06-04 DIAGNOSIS — Z1231 Encounter for screening mammogram for malignant neoplasm of breast: Secondary | ICD-10-CM

## 2023-06-16 ENCOUNTER — Ambulatory Visit
Admission: RE | Admit: 2023-06-16 | Discharge: 2023-06-16 | Disposition: A | Discharge status: Home / Self Care | Payer: BC Managed Care – PPO | Source: Ambulatory Visit | Attending: Nurse Practitioner | Admitting: Nurse Practitioner

## 2023-06-16 DIAGNOSIS — Z1231 Encounter for screening mammogram for malignant neoplasm of breast: Secondary | ICD-10-CM

## 2023-07-13 ENCOUNTER — Ambulatory Visit: Payer: BC Managed Care – PPO | Admitting: Nurse Practitioner

## 2023-07-19 NOTE — Progress Notes (Unsigned)
Madelaine Bhat, CMA,acting as a Neurosurgeon for Arnette Felts, FNP.,have documented all relevant documentation on the behalf of Arnette Felts, FNP,as directed by  Arnette Felts, FNP while in the presence of Arnette Felts, FNP.  Subjective:    Patient ID: Catherine Fritz , female    DOB: 1961-02-12 , 62 y.o.   MRN: [id number]  No chief complaint on file.   HPI  Patient presents today for HM, patient reports compliance with medication. Patient denies any chest pain, SOB, or headaches. Patient has no concerns today.     Past Medical History:  Diagnosis Date  . Hypertension      Family History  Problem Relation Age of Onset  . Breast cancer Paternal Aunt        in 59's  . Hypertension Mother   . Thyroid disease Mother   . Heart disease Father   . Cancer Other   . Diabetes Sister   . Thyroid disease Sister   . Diabetes Brother   . Kidney disease Brother   . Thyroid disease Brother      Current Outpatient Medications:  .  amLODipine (NORVASC) 10 MG tablet, Take 1 tablet (10 mg total) by mouth daily at night., Disp: 90 tablet, Rfl: 1 .  hydrochlorothiazide (HYDRODIURIL) 12.5 MG tablet, Take 1 tablet (12.5 mg total) by mouth daily., Disp: 90 tablet, Rfl: 1 .  MULTIPLE VITAMIN PO, Take 1 tablet by mouth daily., Disp: , Rfl:    Allergies  Allergen Reactions  . Codeine Rash  . Contrave [Naltrexone-Bupropion Hcl Er] Rash      The patient states she uses {contraceptive methods:5051} for birth control. Patient's last menstrual period was 09/07/2012.. {Dysmenorrhea-menorrhagia:21918}. Negative for: breast discharge, breast lump(s), breast pain and breast self exam. Associated symptoms include abnormal vaginal bleeding. Pertinent negatives include abnormal bleeding (hematology), anxiety, decreased libido, depression, difficulty falling sleep, dyspareunia, history of infertility, nocturia, sexual dysfunction, sleep disturbances, urinary incontinence, urinary urgency, vaginal discharge and vaginal  itching. Diet regular.The patient states her exercise level is    . The patient's tobacco use is:  Social History   Tobacco Use  Smoking Status Never  Smokeless Tobacco Never  . She has been exposed to passive smoke. The patient's alcohol use is:  Social History   Substance and Sexual Activity  Alcohol Use No  . Additional information: Last pap ***, next one scheduled for ***.    Review of Systems   There were no vitals filed for this visit. There is no height or weight on file to calculate BMI.  Wt Readings from Last 3 Encounters:  03/11/23 185 lb (83.9 kg)  02/09/23 180 lb (81.6 kg)  02/02/23 180 lb (81.6 kg)     Objective:  Physical Exam      Assessment And Plan:     Encounter for annual health examination  Essential hypertension  Prediabetes  Mixed hyperlipidemia     No follow-ups on file. Patient was given opportunity to ask questions. Patient verbalized understanding of the plan and was able to repeat key elements of the plan. All questions were answered to their satisfaction.   Arnette Felts, FNP  I, Arnette Felts, FNP, have reviewed all documentation for this visit. The documentation on 07/19/23 for the exam, diagnosis, procedures, and orders are all accurate and complete.

## 2023-07-20 ENCOUNTER — Ambulatory Visit
Admission: RE | Admit: 2023-07-20 | Discharge: 2023-07-20 | Disposition: A | Discharge status: Home / Self Care | Payer: BC Managed Care – PPO | Source: Ambulatory Visit | Attending: Nurse Practitioner | Admitting: Nurse Practitioner

## 2023-07-20 ENCOUNTER — Encounter: Payer: Self-pay | Admitting: Nurse Practitioner

## 2023-07-20 ENCOUNTER — Ambulatory Visit (INDEPENDENT_AMBULATORY_CARE_PROVIDER_SITE_OTHER): Payer: BC Managed Care – PPO | Admitting: Nurse Practitioner

## 2023-07-20 VITALS — BP 130/84 | HR 61 | Temp 98.7°F | Ht 60.0 in | Wt 186.0 lb

## 2023-07-20 DIAGNOSIS — Z79899 Other long term (current) drug therapy: Secondary | ICD-10-CM

## 2023-07-20 DIAGNOSIS — I10 Essential (primary) hypertension: Secondary | ICD-10-CM | POA: Diagnosis not present

## 2023-07-20 DIAGNOSIS — Z0000 Encounter for general adult medical examination without abnormal findings: Secondary | ICD-10-CM

## 2023-07-20 DIAGNOSIS — R7303 Prediabetes: Secondary | ICD-10-CM

## 2023-07-20 DIAGNOSIS — M25561 Pain in right knee: Secondary | ICD-10-CM

## 2023-07-20 DIAGNOSIS — E782 Mixed hyperlipidemia: Secondary | ICD-10-CM | POA: Diagnosis not present

## 2023-07-20 DIAGNOSIS — W19XXXA Unspecified fall, initial encounter: Secondary | ICD-10-CM

## 2023-07-20 DIAGNOSIS — Z23 Encounter for immunization: Secondary | ICD-10-CM | POA: Insufficient documentation

## 2023-07-20 LAB — POCT URINALYSIS DIP (CLINITEK)
Blood, UA: NEGATIVE
Glucose, UA: NEGATIVE mg/dL
Ketones, POC UA: NEGATIVE mg/dL
Leukocytes, UA: NEGATIVE
Nitrite, UA: NEGATIVE
POC PROTEIN,UA: 30 — AB
Spec Grav, UA: 1.025 (ref 1.010–1.025)
Urobilinogen, UA: 1 U/dL
pH, UA: 7 (ref 5.0–8.0)

## 2023-07-20 NOTE — Assessment & Plan Note (Signed)
Blood pressure is fairly controlled. Continue focusing on exercising regularly and healthy diet

## 2023-07-20 NOTE — Assessment & Plan Note (Signed)
She had a fall 5 months ago.

## 2023-07-20 NOTE — Assessment & Plan Note (Addendum)
Cholesterol levels have been elevated but not taking any medications, discussed she may need to start a medication and consider calcium risk score. Will decide once get lab results

## 2023-07-20 NOTE — Assessment & Plan Note (Signed)
2nd shingrix given in office.

## 2023-07-20 NOTE — Assessment & Plan Note (Signed)
HgbA1c is stable.

## 2023-07-20 NOTE — Assessment & Plan Note (Signed)

## 2023-07-20 NOTE — Assessment & Plan Note (Signed)
Mild crepitus to right lateral knee, negative drawer test

## 2023-07-20 NOTE — Patient Instructions (Signed)
Go to [facility] for your xray you do not need an appt

## 2023-07-21 LAB — CBC WITH DIFFERENTIAL/PLATELET
Basophils Absolute: 0 10*3/uL (ref 0.0–0.2)
Basos: 1 %
EOS (ABSOLUTE): 0.1 10*3/uL (ref 0.0–0.4)
Eos: 3 %
Hematocrit: 41.8 % (ref 34.0–46.6)
Hemoglobin: 14.3 g/dL (ref 11.1–15.9)
Immature Grans (Abs): 0 10*3/uL (ref 0.0–0.1)
Immature Granulocytes: 0 %
Lymphocytes Absolute: 1.7 10*3/uL (ref 0.7–3.1)
Lymphs: 46 %
MCH: 27.6 pg (ref 26.6–33.0)
MCHC: 34.2 g/dL (ref 31.5–35.7)
MCV: 81 fL (ref 79–97)
Monocytes Absolute: 0.4 10*3/uL (ref 0.1–0.9)
Monocytes: 9 %
Neutrophils Absolute: 1.6 10*3/uL (ref 1.4–7.0)
Neutrophils: 41 %
Platelets: 287 10*3/uL (ref 150–450)
RBC: 5.18 x10E6/uL (ref 3.77–5.28)
RDW: 13.5 % (ref 11.7–15.4)
WBC: 3.8 10*3/uL (ref 3.4–10.8)

## 2023-07-21 LAB — CMP14+EGFR
ALT: 28 IU/L (ref 0–32)
AST: 26 IU/L (ref 0–40)
Albumin: 4.4 g/dL (ref 3.9–4.9)
Alkaline Phosphatase: 114 IU/L (ref 44–121)
BUN/Creatinine Ratio: 15 (ref 12–28)
BUN: 15 mg/dL (ref 8–27)
Bilirubin Total: 0.5 mg/dL (ref 0.0–1.2)
CO2: 25 mmol/L (ref 20–29)
Calcium: 9.6 mg/dL (ref 8.7–10.3)
Chloride: 100 mmol/L (ref 96–106)
Creatinine, Ser: 0.98 mg/dL (ref 0.57–1.00)
Globulin, Total: 3.2 g/dL (ref 1.5–4.5)
Glucose: 107 mg/dL — ABNORMAL HIGH (ref 70–99)
Potassium: 3.7 mmol/L (ref 3.5–5.2)
Sodium: 141 mmol/L (ref 134–144)
Total Protein: 7.6 g/dL (ref 6.0–8.5)
eGFR: 64 mL/min/{1.73_m2} (ref >59)

## 2023-07-21 LAB — MICROALBUMIN / CREATININE URINE RATIO
Creatinine, Urine: 315.4 mg/dL
Microalb/Creat Ratio: 7 mg/g{creat} (ref 0–29)
Microalbumin, Urine: 20.7 ug/mL

## 2023-07-21 LAB — HEMOGLOBIN A1C
Est. average glucose Bld gHb Est-mCnc: 126 mg/dL
Hgb A1c MFr Bld: 6 % — ABNORMAL HIGH (ref 4.8–5.6)

## 2023-07-21 LAB — LIPID PANEL
Chol/HDL Ratio: 3 ratio (ref 0.0–4.4)
Cholesterol, Total: 230 mg/dL — ABNORMAL HIGH (ref 100–199)
HDL: 77 mg/dL (ref >39)
LDL Chol Calc (NIH): 140 mg/dL — ABNORMAL HIGH (ref 0–99)
Triglycerides: 75 mg/dL (ref 0–149)
VLDL Cholesterol Cal: 13 mg/dL (ref 5–40)

## 2023-09-30 ENCOUNTER — Other Ambulatory Visit: Payer: Self-pay | Admitting: Nurse Practitioner

## 2023-09-30 ENCOUNTER — Other Ambulatory Visit (HOSPITAL_COMMUNITY): Payer: Self-pay

## 2023-09-30 DIAGNOSIS — I10 Essential (primary) hypertension: Secondary | ICD-10-CM

## 2023-09-30 MED ORDER — AMLODIPINE BESYLATE 10 MG PO TABS
10.0000 mg | ORAL_TABLET | Freq: Every day | ORAL | 1 refills | Status: DC
  Filled 2023-09-30: qty 90, 90d supply, fill #0
  Filled 2024-01-03: qty 90, 90d supply, fill #1

## 2023-09-30 MED ORDER — HYDROCHLOROTHIAZIDE 12.5 MG PO TABS
12.5000 mg | ORAL_TABLET | Freq: Every day | ORAL | 1 refills | Status: DC
Start: 2023-09-30 — End: 2024-07-30
  Filled 2023-09-30: qty 90, 90d supply, fill #0
  Filled 2024-01-03: qty 90, 90d supply, fill #1

## 2023-10-01 ENCOUNTER — Other Ambulatory Visit (HOSPITAL_COMMUNITY): Payer: Self-pay

## 2023-11-08 ENCOUNTER — Encounter: Payer: Self-pay | Admitting: Nurse Practitioner

## 2023-11-12 ENCOUNTER — Ambulatory Visit (INDEPENDENT_AMBULATORY_CARE_PROVIDER_SITE_OTHER): Payer: 59

## 2023-11-12 VITALS — BP 130/82 | HR 75 | Temp 98.1°F | Ht 60.0 in | Wt 186.0 lb

## 2023-11-12 DIAGNOSIS — Z23 Encounter for immunization: Secondary | ICD-10-CM | POA: Diagnosis not present

## 2023-11-12 NOTE — Progress Notes (Signed)
 Patient presents today for shingles vaccine. She denies being sick today.

## 2023-11-12 NOTE — Progress Notes (Signed)
 Patient presents today for a shingles shot. Patient recived shingles shot in her arm

## 2024-05-31 ENCOUNTER — Other Ambulatory Visit (HOSPITAL_COMMUNITY): Payer: Self-pay

## 2024-06-06 ENCOUNTER — Other Ambulatory Visit: Payer: Self-pay | Admitting: Nurse Practitioner

## 2024-06-06 DIAGNOSIS — Z1231 Encounter for screening mammogram for malignant neoplasm of breast: Secondary | ICD-10-CM

## 2024-06-16 ENCOUNTER — Ambulatory Visit
Admission: RE | Admit: 2024-06-16 | Discharge: 2024-06-16 | Disposition: A | Discharge status: Home / Self Care | Payer: Self-pay | Source: Ambulatory Visit

## 2024-06-16 DIAGNOSIS — Z1231 Encounter for screening mammogram for malignant neoplasm of breast: Secondary | ICD-10-CM

## 2024-07-20 ENCOUNTER — Encounter: Payer: Self-pay | Admitting: Nurse Practitioner

## 2024-07-20 ENCOUNTER — Other Ambulatory Visit (HOSPITAL_COMMUNITY)
Admission: RE | Admit: 2024-07-20 | Discharge: 2024-07-20 | Disposition: A | Discharge status: Home / Self Care | Source: Ambulatory Visit | Attending: Nurse Practitioner | Admitting: Nurse Practitioner

## 2024-07-20 ENCOUNTER — Ambulatory Visit: Payer: BC Managed Care – PPO | Admitting: Nurse Practitioner

## 2024-07-20 VITALS — BP 122/82 | HR 73 | Temp 98.7°F | Ht 60.0 in | Wt 175.4 lb

## 2024-07-20 DIAGNOSIS — R7303 Prediabetes: Secondary | ICD-10-CM

## 2024-07-20 DIAGNOSIS — Z2821 Immunization not carried out because of patient refusal: Secondary | ICD-10-CM

## 2024-07-20 DIAGNOSIS — E782 Mixed hyperlipidemia: Secondary | ICD-10-CM

## 2024-07-20 DIAGNOSIS — Z79899 Other long term (current) drug therapy: Secondary | ICD-10-CM

## 2024-07-20 DIAGNOSIS — I10 Essential (primary) hypertension: Secondary | ICD-10-CM | POA: Diagnosis not present

## 2024-07-20 DIAGNOSIS — Z1151 Encounter for screening for human papillomavirus (HPV): Secondary | ICD-10-CM | POA: Diagnosis not present

## 2024-07-20 DIAGNOSIS — E2839 Other primary ovarian failure: Secondary | ICD-10-CM

## 2024-07-20 DIAGNOSIS — Z124 Encounter for screening for malignant neoplasm of cervix: Secondary | ICD-10-CM | POA: Insufficient documentation

## 2024-07-20 DIAGNOSIS — Z0000 Encounter for general adult medical examination without abnormal findings: Secondary | ICD-10-CM | POA: Diagnosis not present

## 2024-07-20 DIAGNOSIS — Z01419 Encounter for gynecological examination (general) (routine) without abnormal findings: Secondary | ICD-10-CM | POA: Diagnosis present

## 2024-07-20 LAB — POCT URINALYSIS DIP (CLINITEK)
Bilirubin, UA: NEGATIVE
Blood, UA: NEGATIVE
Glucose, UA: NEGATIVE mg/dL
Ketones, POC UA: NEGATIVE mg/dL
Leukocytes, UA: NEGATIVE
Nitrite, UA: NEGATIVE
POC PROTEIN,UA: NEGATIVE
Spec Grav, UA: 1.02 (ref 1.010–1.025)
Urobilinogen, UA: 0.2 U/dL
pH, UA: 8.5 — AB (ref 5.0–8.0)

## 2024-07-20 NOTE — Progress Notes (Deleted)
 LILLETTE Kristeen JINNY Gladis, CMA,acting as a Neurosurgeon for Catherine Ada, FNP.,have documented all relevant documentation on the behalf of Catherine Ada, FNP,as directed by  Catherine Ada, FNP while in the presence of Catherine Ada, FNP.  Subjective:    Patient ID: Catherine Fritz , female    DOB: [birth year] , 63 y.o.   MRN: [id number]  No chief complaint on file.   HPI  HPI   Past Medical History:  Diagnosis Date   Hypertension      Family History  Problem Relation Age of Onset   Breast cancer Paternal Aunt        in 60's   Hypertension Mother    Thyroid  disease Mother    Heart disease Father    Cancer Other    Diabetes Sister    Thyroid  disease Sister    Diabetes Brother    Kidney disease Brother    Thyroid  disease Brother      Current Outpatient Medications:    amLODipine  (NORVASC ) 10 MG tablet, Take 1 tablet (10 mg total) by mouth daily at night., Disp: 90 tablet, Rfl: 1   hydrochlorothiazide  (HYDRODIURIL ) 12.5 MG tablet, Take 1 tablet (12.5 mg total) by mouth daily., Disp: 90 tablet, Rfl: 1   MULTIPLE VITAMIN PO, Take 1 tablet by mouth daily., Disp: , Rfl:    Allergies  Allergen Reactions   Codeine Rash   Contrave  [Naltrexone -Bupropion  Hcl Er] Rash      The patient states she uses {contraceptive methods:5051} for birth control. Patient's last menstrual period was 09/07/2012.. {Dysmenorrhea-menorrhagia:21918}. Negative for: breast discharge, breast lump(s), breast pain and breast self exam. Associated symptoms include abnormal vaginal bleeding. Pertinent negatives include abnormal bleeding (hematology), anxiety, decreased libido, depression, difficulty falling sleep, dyspareunia, history of infertility, nocturia, sexual dysfunction, sleep disturbances, urinary incontinence, urinary urgency, vaginal discharge and vaginal itching. Diet regular.The patient states her exercise level is    . The patient's tobacco use is:  Social History   Tobacco Use  Smoking Status Never  Smokeless  Tobacco Never  . She has been exposed to passive smoke. The patient's alcohol use is:  Social History   Substance and Sexual Activity  Alcohol Use No  . Additional information: Last pap ***, next one scheduled for ***.    Review of Systems   There were no vitals filed for this visit. There is no height or weight on file to calculate BMI.  Wt Readings from Last 3 Encounters:  11/12/23 186 lb (84.4 kg)  07/20/23 186 lb (84.4 kg)  03/11/23 185 lb (83.9 kg)     Objective:  Physical Exam      Assessment And Plan:     Encounter for annual health examination  Essential hypertension  Prediabetes  Mixed hyperlipidemia     No follow-ups on file. Patient was given opportunity to ask questions. Patient verbalized understanding of the plan and was able to repeat key elements of the plan. All questions were answered to their satisfaction.   Catherine Ada, FNP  I, Catherine Ada, FNP, have reviewed all documentation for this visit. The documentation on 07/20/24 for the exam, diagnosis, procedures, and orders are all accurate and complete.

## 2024-07-20 NOTE — Progress Notes (Signed)
 Subjective:    Catherine Fritz is a 63 y.o. female who presents for a Welcome to Medicare exam.   HPI  Discussed the use of AI scribe software for clinical note transcription with the patient, who gave verbal consent to proceed.  History of Present Illness Catherine Fritz is a 63 year old female who presents for a Welcome to Medicare visit.  She has not been seen in a year, with her last visit being in September 2024. She is currently working part-time, 24 hours every two weeks, which allows her flexibility for travel and personal time. She has set health goals for the next year, including losing about 15 pounds, discontinuing her blood pressure medication, and becoming proficient in swimming. She desires to become a grandmother, although she acknowledges she has no control over this.  Her current exercise routine includes going to the [facility] with her husband, aiming for 150 minutes of exercise per week, primarily riding bikes for 45 to 50 minutes three times a week. She has not exercised in the past week due to wedding preparations but plans to resume. She reports no pain and is thankful for that. She is aware of her sleep habits needing improvement, as she used to work night shifts and is not getting the full amount of sleep needed. She is trying to adjust her habits to achieve at least six hours of sleep, with a goal of eight hours.  She has lost 11 pounds since her last visit, with her current weight at 175 pounds, down from 186 pounds. Her goal weight is 160 pounds, and she is focused on maintaining her blood pressure without looking 'like I'm starving'. She is currently on Amlodipine  10 mg and Hydrochlorothiazide  12.5 mg for hypertension.  She has a history of a sebaceous cyst under her arm, which has not changed in size or location. No swelling in her feet or ankles, constipation, diarrhea, or urinary issues. She is not on any opioids and does not have any feelings of depression. Her memory  is reported as good. She is due for a bone density test, mammogram, and Pap smear. She has an advance directive but needs to provide a copy for her medical records.    Objective:    Today's Vitals   07/20/24 0930  BP: 122/82  Pulse: 73  Temp: 98.7 F (37.1 C)  TempSrc: Oral  Weight: 175 lb 6.4 oz (79.6 kg)  Height: 5' (1.524 m)  PainSc: 0-No pain  Body mass index is 34.26 kg/m.  Medications Outpatient Encounter Medications as of 07/20/2024  Medication Sig   amLODipine  (NORVASC ) 10 MG tablet Take 1 tablet (10 mg total) by mouth daily at night.   hydrochlorothiazide  (HYDRODIURIL ) 12.5 MG tablet Take 1 tablet (12.5 mg total) by mouth daily.   MULTIPLE VITAMIN PO Take 1 tablet by mouth daily.   No facility-administered encounter medications on file as of 07/20/2024.     History: Past Medical History:  Diagnosis Date   GERD (gastroesophageal reflux disease) 09/90   Hypertension    Past Surgical History:  Procedure Laterality Date   TUBAL LIGATION  1993    Family History  Problem Relation Age of Onset   Breast cancer Paternal Aunt        in 48's   Hypertension Mother    Thyroid  disease Mother    Heart disease Father    Arthritis Father    Cancer Other    Diabetes Sister    Thyroid  disease Sister  Diabetes Brother    Kidney disease Brother    Thyroid  disease Brother    Alcohol abuse Brother    Alcohol abuse Brother    Social History   Occupational History   Not on file  Tobacco Use   Smoking status: Never   Smokeless tobacco: Never   Tobacco comments:    Never smoked  Substance and Sexual Activity   Alcohol use: No   Drug use: Never   Sexual activity: Yes    Birth control/protection: Post-menopausal, None    Tobacco Counseling Counseling given: Not Answered Tobacco comments: Never smoked   Immunizations and Health Maintenance Immunization History  Administered Date(s) Administered   Influenza-Unspecified 08/02/2019, 07/29/2020, 09/02/2022    PFIZER(Purple Top)SARS-COV-2 Vaccination 11/09/2019, 11/28/2019, 09/27/2020   Tdap 03/19/2020   Zoster Recombinant(Shingrix ) 07/20/2023, 11/12/2023   Health Maintenance Due  Topic Date Due   Pneumococcal Vaccine: 50+ Years (1 of 1 - PCV) Never done   DEXA SCAN  Never done    Activities of Daily Living     No data to display          Physical Exam   Physical Exam Vitals and nursing note reviewed.  Constitutional:      General: She is not in acute distress.    Appearance: Normal appearance.  HENT:     Head: Normocephalic and atraumatic.     Right Ear: Tympanic membrane, ear canal and external ear normal. There is no impacted cerumen.     Left Ear: Tympanic membrane, ear canal and external ear normal. There is no impacted cerumen.     Nose: Nose normal.     Mouth/Throat:     Mouth: Mucous membranes are moist.  Cardiovascular:     Rate and Rhythm: Normal rate and regular rhythm.     Pulses: Normal pulses.     Heart sounds: Normal heart sounds.  Pulmonary:     Effort: Pulmonary effort is normal. No respiratory distress.  Abdominal:     General: Abdomen is flat. Bowel sounds are normal.     Palpations: Abdomen is soft.     Hernia: There is no hernia in the left inguinal area or right inguinal area.  Genitourinary:    General: Normal vulva.     Tanner stage (genital): 5.     Labia:        Right: No rash.        Left: No rash.      Vagina: Normal.     Cervix: Normal.     Uterus: Normal.      Adnexa: Right adnexa normal and left adnexa normal.     Rectum: Normal. Guaiac result negative.  Musculoskeletal:        General: Normal range of motion.     Cervical back: Normal range of motion and neck supple.  Skin:    General: Skin is warm and dry.     Capillary Refill: Capillary refill takes less than 2 seconds.  Neurological:     General: No focal deficit present.     Mental Status: She is alert and oriented to person, place, and time.     Cranial Nerves: No cranial  nerve deficit.     Motor: No weakness.  Psychiatric:        Mood and Affect: Mood and affect normal.        Behavior: Behavior normal.        Thought Content: Thought content normal.        Cognition  and Memory: Memory normal.        Judgment: Judgment normal.    (optional), or other factors deemed appropriate based on the beneficiary's medical and social history and current clinical standards.  Physical Exam Vitals and nursing note reviewed.  Constitutional:      General: She is not in acute distress.    Appearance: Normal appearance.  HENT:     Head: Normocephalic and atraumatic.     Right Ear: Tympanic membrane, ear canal and external ear normal. There is no impacted cerumen.     Left Ear: Tympanic membrane, ear canal and external ear normal. There is no impacted cerumen.     Nose: Nose normal.     Mouth/Throat:     Mouth: Mucous membranes are moist.  Cardiovascular:     Rate and Rhythm: Normal rate and regular rhythm.     Pulses: Normal pulses.     Heart sounds: Normal heart sounds.  Pulmonary:     Effort: Pulmonary effort is normal. No respiratory distress.  Abdominal:     General: Abdomen is flat. Bowel sounds are normal.     Palpations: Abdomen is soft.     Hernia: There is no hernia in the left inguinal area or right inguinal area.  Genitourinary:    General: Normal vulva.     Tanner stage (genital): 5.     Labia:        Right: No rash.        Left: No rash.      Vagina: Normal.     Cervix: Normal.     Uterus: Normal.      Adnexa: Right adnexa normal and left adnexa normal.     Rectum: Normal. Guaiac result negative.  Musculoskeletal:        General: Normal range of motion.     Cervical back: Normal range of motion and neck supple.  Skin:    General: Skin is warm and dry.     Capillary Refill: Capillary refill takes less than 2 seconds.  Neurological:     General: No focal deficit present.     Mental Status: She is alert and oriented to person, place, and  time.     Cranial Nerves: No cranial nerve deficit.     Motor: No weakness.  Psychiatric:        Mood and Affect: Mood and affect normal.        Behavior: Behavior normal.        Thought Content: Thought content normal.        Cognition and Memory: Memory normal.        Judgment: Judgment normal.     Advanced Directives: Does Patient Have a Medical Advance Directive?: Yes  EKG:  normal EKG, normal sinus rhythm      Assessment:    This is a routine wellness examination for this patient .   Vision/Hearing screen Hearing Screening   500Hz  1000Hz  2000Hz  4000Hz   Right ear 20 20 20 20   Left ear 20 20 20 20    Vision Screening   Right eye Left eye Both eyes  Without correction     With correction 20/20 20/20 20/20      Goals       Blood Pressure < 140/90 (pt-stated)      Would like to come off the blood pressure medications      Weight (lb) < 160 lb (72.6 kg)       I would like to lose about 15  lbs in the next 12 months         Depression Screen    07/20/2024    9:44 AM 07/20/2024    9:42 AM 07/20/2023    9:23 AM 11/19/2022    4:14 PM  PHQ 2/9 Scores  PHQ - 2 Score 0 0 0 0  PHQ- 9 Score 2  0      Fall Risk    07/20/2024    9:44 AM  Fall Risk   Falls in the past year? 0  Number falls in past yr: 0  Injury with Fall? 0  Risk for fall due to : No Fall Risks  Follow up Falls evaluation completed    Cognitive Function:        07/20/2024   10:00 AM  6CIT Screen  What Year? 0 points  What month? 0 points  What time? 0 points  Count back from 20 0 points  Months in reverse 0 points  Repeat phrase 0 points  Total Score 0 points   Wt Readings from Last 3 Encounters:  07/20/24 175 lb 6.4 oz (79.6 kg)  11/12/23 186 lb (84.4 kg)  07/20/23 186 lb (84.4 kg)    Patient Care Team: Georgina Speaks, FNP as PCP - General (General Practice)     Plan:   Problem List Items Addressed This Visit       Cardiovascular and Mediastinum   Essential  hypertension - Primary   Blood pressure is fairly controlled. Continue focusing on exercising regularly and healthy diet      Relevant Orders   CMP14+EGFR (Completed)   EKG 12-Lead (Completed)   POCT URINALYSIS DIP (CLINITEK) (Completed)   Microalbumin / creatinine urine ratio (Completed)     Other   Mixed hyperlipidemia   Cholesterol levels have been elevated but not taking any medications      Relevant Orders   Lipid panel (Completed)   Prediabetes   HgbA1c is stable. Continue focusing on healthy diet.      Relevant Orders   Hemoglobin A1c (Completed)   Encounter for Papanicolaou smear of cervix   Relevant Orders   Cytology -Pap Smear (Completed)   Encounter for Medicare annual wellness exam   Pt's annual wellness exam was performed and geriatric assessment reviewed.  Pt has no new identiafble wellness concerns at this time.  WIll obtain routine labs.  Will obtain UA and micro.  Behavior modifications discussed and diet history reviewed. Pt will continue to exercise regularly and modify diet, with low GI, plant based foods and decrease food intake of processed foods.  Recommend intake of daily multivitamin, Vitamin D, and calcium. Recommend mammogram and colonoscopy for preventive screenings, as well as recommend immunizations that include influenza (up to date) and TDAP       Relevant Orders   EKG 12-Lead (Completed)   POCT URINALYSIS DIP (CLINITEK) (Completed)   Microalbumin / creatinine urine ratio (Completed)   Other Visit Diagnoses       COVID-19 vaccination declined         Influenza vaccination declined         Other long term (current) drug therapy       Relevant Orders   CBC with Differential/Platelet (Completed)   TSH (Completed)     Decreased estrogen level       Relevant Orders   DG Bone Density       I have personally reviewed and noted the following in the patient's chart:   Medical and social  history Use of alcohol, tobacco or illicit drugs   Current medications and supplements including opioid prescriptions. Patient is not currently taking opioid prescriptions.po Functional ability and status Nutritional status Physical activity Advanced directives List of other physicians Hospitalizations, surgeries, and ER visits in previous 12 months Vitals Screenings to include cognitive, depression, and falls Referrals and appointments  In addition, I have reviewed and discussed with patient certain preventive protocols, quality metrics, and best practice recommendations. A written personalized care plan for preventive services as well as general preventive health recommendations were provided to patient.     [name], FNP 07/30/2024

## 2024-07-20 NOTE — Patient Instructions (Signed)
 Health Maintenance  Topic Date Due   Pneumococcal Vaccine for age over 59 (1 of 1 - PCV) Never done   DEXA scan (bone density measurement)  Never done   Pap with HPV screening  03/25/2024   COVID-19 Vaccine (4 - 2025-26 season) 08/05/2024*   Flu Shot  02/06/2025*   Medicare Annual Wellness Visit  07/20/2025   Breast Cancer Screening  06/16/2026   Colon Cancer Screening  08/13/2029   DTaP/Tdap/Td vaccine (2 - Td or Tdap) 03/19/2030   Hepatitis C Screening  Completed   HIV Screening  Completed   Zoster (Shingles) Vaccine  Completed   Hepatitis B Vaccine  Aged Out   HPV Vaccine  Aged Out   Meningitis B Vaccine  Aged Out  *Topic was postponed. The date shown is not the original due date.

## 2024-07-21 LAB — CMP14+EGFR
ALT: 21 IU/L (ref 0–32)
AST: 23 IU/L (ref 0–40)
Albumin: 4.3 g/dL (ref 3.9–4.9)
Alkaline Phosphatase: 102 IU/L (ref 44–121)
BUN/Creatinine Ratio: 13 (ref 12–28)
BUN: 13 mg/dL (ref 8–27)
Bilirubin Total: 0.6 mg/dL (ref 0.0–1.2)
CO2: 25 mmol/L (ref 20–29)
Calcium: 9.6 mg/dL (ref 8.7–10.3)
Chloride: 99 mmol/L (ref 96–106)
Creatinine, Ser: 0.99 mg/dL (ref 0.57–1.00)
Globulin, Total: 2.9 g/dL (ref 1.5–4.5)
Glucose: 91 mg/dL (ref 70–99)
Potassium: 3.6 mmol/L (ref 3.5–5.2)
Sodium: 139 mmol/L (ref 134–144)
Total Protein: 7.2 g/dL (ref 6.0–8.5)
eGFR: 63 mL/min/1.73 (ref >59)

## 2024-07-21 LAB — CBC WITH DIFFERENTIAL/PLATELET
Basophils Absolute: 0 x10E3/uL (ref 0.0–0.2)
Basos: 1 %
EOS (ABSOLUTE): 0.1 x10E3/uL (ref 0.0–0.4)
Eos: 3 %
Hematocrit: 41.3 % (ref 34.0–46.6)
Hemoglobin: 13.9 g/dL (ref 11.1–15.9)
Immature Grans (Abs): 0 x10E3/uL (ref 0.0–0.1)
Immature Granulocytes: 0 %
Lymphocytes Absolute: 2.1 x10E3/uL (ref 0.7–3.1)
Lymphs: 51 %
MCH: 27.4 pg (ref 26.6–33.0)
MCHC: 33.7 g/dL (ref 31.5–35.7)
MCV: 82 fL (ref 79–97)
Monocytes Absolute: 0.4 x10E3/uL (ref 0.1–0.9)
Monocytes: 9 %
Neutrophils Absolute: 1.5 x10E3/uL (ref 1.4–7.0)
Neutrophils: 36 %
Platelets: 277 x10E3/uL (ref 150–450)
RBC: 5.07 x10E6/uL (ref 3.77–5.28)
RDW: 14.1 % (ref 11.7–15.4)
WBC: 4 x10E3/uL (ref 3.4–10.8)

## 2024-07-21 LAB — MICROALBUMIN / CREATININE URINE RATIO
Creatinine, Urine: 130.3 mg/dL
Microalb/Creat Ratio: 4 mg/g{creat} (ref 0–29)
Microalbumin, Urine: 5 ug/mL

## 2024-07-21 LAB — LIPID PANEL
Chol/HDL Ratio: 3 ratio (ref 0.0–4.4)
Cholesterol, Total: 219 mg/dL — ABNORMAL HIGH (ref 100–199)
HDL: 72 mg/dL (ref >39)
LDL Chol Calc (NIH): 135 mg/dL — ABNORMAL HIGH (ref 0–99)
Triglycerides: 70 mg/dL (ref 0–149)
VLDL Cholesterol Cal: 12 mg/dL (ref 5–40)

## 2024-07-21 LAB — HEMOGLOBIN A1C
Est. average glucose Bld gHb Est-mCnc: 108 mg/dL
Hgb A1c MFr Bld: 5.4 % (ref 4.8–5.6)

## 2024-07-21 LAB — TSH: TSH: 1.14 u[IU]/mL (ref 0.450–4.500)

## 2024-07-24 ENCOUNTER — Ambulatory Visit: Payer: Self-pay | Admitting: Nurse Practitioner

## 2024-07-24 LAB — CYTOLOGY - PAP
Comment: NEGATIVE
Diagnosis: NEGATIVE
High risk HPV: NEGATIVE

## 2024-07-30 ENCOUNTER — Other Ambulatory Visit: Payer: Self-pay | Admitting: Nurse Practitioner

## 2024-07-30 DIAGNOSIS — I10 Essential (primary) hypertension: Secondary | ICD-10-CM

## 2024-07-30 DIAGNOSIS — Z124 Encounter for screening for malignant neoplasm of cervix: Secondary | ICD-10-CM | POA: Insufficient documentation

## 2024-07-30 DIAGNOSIS — Z0000 Encounter for general adult medical examination without abnormal findings: Secondary | ICD-10-CM | POA: Insufficient documentation

## 2024-07-30 NOTE — Assessment & Plan Note (Signed)
HgbA1c is stable. Continue focusing on healthy diet.

## 2024-07-30 NOTE — Assessment & Plan Note (Signed)
Blood pressure is fairly controlled. Continue focusing on exercising regularly and healthy diet

## 2024-07-30 NOTE — Assessment & Plan Note (Signed)
 Cholesterol levels have been elevated but not taking any medications

## 2024-07-30 NOTE — Assessment & Plan Note (Signed)
Pt's annual wellness exam was performed and geriatric assessment reviewed.  Pt has no new identiafble wellness concerns at this time.  WIll obtain routine labs.  Will obtain UA and micro.  Behavior modifications discussed and diet history reviewed. Pt will continue to exercise regularly and modify diet, with low GI, plant based foods and decrease food intake of processed foods.  Recommend intake of daily multivitamin, Vitamin D, and calcium. Recommend mammogram and colonoscopy for preventive screenings, as well as recommend immunizations that include influenza (up to date) and TDAP

## 2024-07-31 ENCOUNTER — Other Ambulatory Visit (HOSPITAL_COMMUNITY): Payer: Self-pay

## 2024-07-31 MED ORDER — AMLODIPINE BESYLATE 10 MG PO TABS
10.0000 mg | ORAL_TABLET | Freq: Every day | ORAL | 1 refills | Status: AC
  Filled 2024-07-31: qty 90, 90d supply, fill #0

## 2024-07-31 MED ORDER — HYDROCHLOROTHIAZIDE 12.5 MG PO TABS
12.5000 mg | ORAL_TABLET | Freq: Every day | ORAL | 1 refills | Status: AC
  Filled 2024-07-31: qty 90, 90d supply, fill #0

## 2025-01-17 ENCOUNTER — Encounter: Payer: Self-pay | Admitting: Nurse Practitioner
